# Patient Record
Sex: Female | Born: 1980 | Race: White | Hispanic: No | Marital: Single | Smoking: Current every day smoker
Health system: Southern US, Community
[De-identification: ages and names within clinical notes are randomized; demographics above are authoritative.]

## PROBLEM LIST (undated history)

## (undated) DIAGNOSIS — B999 Unspecified infectious disease: Secondary | ICD-10-CM

## (undated) DIAGNOSIS — N200 Calculus of kidney: Secondary | ICD-10-CM

## (undated) DIAGNOSIS — N390 Urinary tract infection, site not specified: Secondary | ICD-10-CM

## (undated) DIAGNOSIS — N159 Renal tubulo-interstitial disease, unspecified: Secondary | ICD-10-CM

## (undated) HISTORY — PX: DILATION AND CURETTAGE OF UTERUS: SHX78

## (undated) HISTORY — PX: DENTAL SURGERY: SHX609

---

## 2002-05-07 ENCOUNTER — Emergency Department (HOSPITAL_COMMUNITY): Admission: EM | Admit: 2002-05-07 | Discharge: 2002-05-08 | Payer: Self-pay | Admitting: Emergency Medicine

## 2002-05-08 ENCOUNTER — Encounter: Payer: Self-pay | Admitting: Emergency Medicine

## 2007-09-22 ENCOUNTER — Emergency Department: Payer: Self-pay | Admitting: Emergency Medicine

## 2007-10-22 ENCOUNTER — Emergency Department: Payer: Self-pay | Admitting: Emergency Medicine

## 2007-12-04 ENCOUNTER — Emergency Department: Payer: Self-pay | Admitting: Emergency Medicine

## 2008-03-10 ENCOUNTER — Emergency Department: Payer: Self-pay | Admitting: Emergency Medicine

## 2008-05-28 ENCOUNTER — Observation Stay: Payer: Self-pay | Admitting: Obstetrics and Gynecology

## 2008-06-17 ENCOUNTER — Observation Stay: Payer: Self-pay | Admitting: Obstetrics & Gynecology

## 2008-10-09 ENCOUNTER — Inpatient Hospital Stay: Payer: Self-pay | Admitting: Obstetrics and Gynecology

## 2009-05-12 ENCOUNTER — Emergency Department: Payer: Self-pay | Admitting: Emergency Medicine

## 2009-06-29 ENCOUNTER — Emergency Department: Payer: Self-pay | Admitting: Emergency Medicine

## 2010-01-16 ENCOUNTER — Emergency Department: Payer: Self-pay | Admitting: Emergency Medicine

## 2010-10-20 ENCOUNTER — Emergency Department: Payer: Self-pay | Admitting: Emergency Medicine

## 2010-12-05 ENCOUNTER — Ambulatory Visit: Payer: Self-pay | Admitting: Obstetrics & Gynecology

## 2010-12-06 ENCOUNTER — Ambulatory Visit: Payer: Self-pay | Admitting: Obstetrics & Gynecology

## 2010-12-11 LAB — PATHOLOGY REPORT

## 2010-12-27 ENCOUNTER — Emergency Department: Payer: Self-pay | Admitting: Unknown Physician Specialty

## 2010-12-28 ENCOUNTER — Ambulatory Visit: Payer: Self-pay

## 2011-03-17 ENCOUNTER — Emergency Department: Payer: Self-pay | Admitting: Emergency Medicine

## 2011-05-11 ENCOUNTER — Emergency Department: Payer: Self-pay | Admitting: Emergency Medicine

## 2011-05-16 ENCOUNTER — Emergency Department: Payer: Self-pay | Admitting: Unknown Physician Specialty

## 2011-06-09 ENCOUNTER — Emergency Department: Payer: Self-pay | Admitting: Internal Medicine

## 2011-06-19 ENCOUNTER — Emergency Department: Payer: Self-pay | Admitting: *Deleted

## 2011-08-31 ENCOUNTER — Emergency Department: Payer: Self-pay | Admitting: *Deleted

## 2011-08-31 ENCOUNTER — Emergency Department: Payer: Self-pay | Admitting: Unknown Physician Specialty

## 2011-11-12 ENCOUNTER — Emergency Department: Payer: Self-pay | Admitting: Emergency Medicine

## 2011-12-30 ENCOUNTER — Observation Stay: Payer: Self-pay | Admitting: Obstetrics and Gynecology

## 2011-12-30 LAB — URINALYSIS, COMPLETE
Blood: NEGATIVE
Glucose,UR: NEGATIVE mg/dL (ref 0–75)
Ketone: NEGATIVE
Nitrite: NEGATIVE
Specific Gravity: 1.01 (ref 1.003–1.030)

## 2012-01-22 ENCOUNTER — Observation Stay: Payer: Self-pay

## 2012-01-22 LAB — URINALYSIS, COMPLETE
Glucose,UR: NEGATIVE mg/dL (ref 0–75)
Ketone: NEGATIVE
Nitrite: NEGATIVE
Protein: NEGATIVE
Specific Gravity: 1.016 (ref 1.003–1.030)
WBC UR: 14 /HPF (ref 0–5)

## 2012-01-23 LAB — DRUG SCREEN, URINE
Amphetamines, Ur Screen: NEGATIVE (ref ?–1000)
Barbiturates, Ur Screen: NEGATIVE (ref ?–200)
Benzodiazepine, Ur Scrn: NEGATIVE (ref ?–200)
Cannabinoid 50 Ng, Ur ~~LOC~~: POSITIVE (ref ?–50)
Cocaine Metabolite,Ur ~~LOC~~: NEGATIVE (ref ?–300)
Methadone, Ur Screen: NEGATIVE (ref ?–300)
Tricyclic, Ur Screen: NEGATIVE (ref ?–1000)

## 2012-01-24 ENCOUNTER — Observation Stay: Payer: Self-pay

## 2012-01-24 LAB — CBC WITH DIFFERENTIAL/PLATELET
Basophil #: 0 10*3/uL (ref 0.0–0.1)
Basophil %: 0.2 %
Eosinophil %: 0.7 %
HCT: 27.9 % — ABNORMAL LOW (ref 35.0–47.0)
Lymphocyte %: 20.4 %
MCH: 29.2 pg (ref 26.0–34.0)
Neutrophil %: 74.2 %
Platelet: 310 10*3/uL (ref 150–440)
RBC: 3.26 10*6/uL — ABNORMAL LOW (ref 3.80–5.20)
WBC: 8.4 10*3/uL (ref 3.6–11.0)

## 2012-01-24 LAB — URINALYSIS, COMPLETE
Blood: NEGATIVE
Ketone: NEGATIVE
Nitrite: NEGATIVE
Protein: NEGATIVE
Specific Gravity: 1.017 (ref 1.003–1.030)
WBC UR: 68 /HPF (ref 0–5)

## 2012-01-24 LAB — BASIC METABOLIC PANEL
Calcium, Total: 7.6 mg/dL — ABNORMAL LOW (ref 8.5–10.1)
Co2: 22 mmol/L (ref 21–32)
Creatinine: 0.37 mg/dL — ABNORMAL LOW (ref 0.60–1.30)
EGFR (Non-African Amer.): >60
Glucose: 76 mg/dL (ref 65–99)
Osmolality: 275 (ref 275–301)
Potassium: 3.2 mmol/L — ABNORMAL LOW (ref 3.5–5.1)

## 2012-01-24 LAB — DRUG SCREEN, URINE
Barbiturates, Ur Screen: NEGATIVE (ref ?–200)
Benzodiazepine, Ur Scrn: NEGATIVE (ref ?–200)
Cocaine Metabolite,Ur ~~LOC~~: NEGATIVE (ref ?–300)
Methadone, Ur Screen: NEGATIVE (ref ?–300)
Opiate, Ur Screen: POSITIVE (ref ?–300)
Phencyclidine (PCP) Ur S: NEGATIVE (ref ?–25)

## 2012-01-24 LAB — FETAL FIBRONECTIN: Fetal Fibronectin: NEGATIVE

## 2012-01-27 LAB — URINE CULTURE

## 2012-01-29 LAB — URINE CULTURE

## 2012-03-31 ENCOUNTER — Inpatient Hospital Stay: Payer: Self-pay

## 2012-03-31 LAB — DRUG SCREEN, URINE
Barbiturates, Ur Screen: NEGATIVE (ref ?–200)
Cannabinoid 50 Ng, Ur ~~LOC~~: NEGATIVE (ref ?–50)
Cocaine Metabolite,Ur ~~LOC~~: NEGATIVE (ref ?–300)
Methadone, Ur Screen: NEGATIVE (ref ?–300)
Opiate, Ur Screen: POSITIVE (ref ?–300)
Tricyclic, Ur Screen: NEGATIVE (ref ?–1000)

## 2012-03-31 LAB — CBC WITH DIFFERENTIAL/PLATELET
Basophil #: 0.3 10*3/uL — ABNORMAL HIGH (ref 0.0–0.1)
Basophil %: 3.1 %
Eosinophil %: 1.5 %
HCT: 34.7 % — ABNORMAL LOW (ref 35.0–47.0)
Lymphocyte #: 2.1 10*3/uL (ref 1.0–3.6)
MCH: 29.7 pg (ref 26.0–34.0)
MCHC: 33.5 g/dL (ref 32.0–36.0)
MCV: 89 fL (ref 80–100)
Monocyte %: 5.2 %
Neutrophil #: 7.7 10*3/uL — ABNORMAL HIGH (ref 1.4–6.5)
RBC: 3.91 10*6/uL (ref 3.80–5.20)
RDW: 17.1 % — ABNORMAL HIGH (ref 11.5–14.5)
WBC: 10.9 10*3/uL (ref 3.6–11.0)

## 2012-04-01 LAB — HEMATOCRIT: HCT: 21.8 % — ABNORMAL LOW (ref 35.0–47.0)

## 2013-05-13 ENCOUNTER — Encounter (HOSPITAL_COMMUNITY): Payer: Self-pay | Admitting: Emergency Medicine

## 2013-05-13 ENCOUNTER — Emergency Department (HOSPITAL_COMMUNITY)
Admission: EM | Admit: 2013-05-13 | Discharge: 2013-05-13 | Disposition: A | Discharge status: Home / Self Care | Payer: Self-pay | Attending: Emergency Medicine | Admitting: Emergency Medicine

## 2013-05-13 ENCOUNTER — Emergency Department (HOSPITAL_COMMUNITY): Payer: Self-pay

## 2013-05-13 DIAGNOSIS — N390 Urinary tract infection, site not specified: Secondary | ICD-10-CM | POA: Insufficient documentation

## 2013-05-13 DIAGNOSIS — Z3202 Encounter for pregnancy test, result negative: Secondary | ICD-10-CM | POA: Insufficient documentation

## 2013-05-13 DIAGNOSIS — N938 Other specified abnormal uterine and vaginal bleeding: Secondary | ICD-10-CM | POA: Insufficient documentation

## 2013-05-13 DIAGNOSIS — Z880 Allergy status to penicillin: Secondary | ICD-10-CM | POA: Insufficient documentation

## 2013-05-13 DIAGNOSIS — R319 Hematuria, unspecified: Secondary | ICD-10-CM | POA: Insufficient documentation

## 2013-05-13 DIAGNOSIS — Z8742 Personal history of other diseases of the female genital tract: Secondary | ICD-10-CM | POA: Insufficient documentation

## 2013-05-13 DIAGNOSIS — F17200 Nicotine dependence, unspecified, uncomplicated: Secondary | ICD-10-CM | POA: Insufficient documentation

## 2013-05-13 DIAGNOSIS — R6883 Chills (without fever): Secondary | ICD-10-CM | POA: Insufficient documentation

## 2013-05-13 DIAGNOSIS — R300 Dysuria: Secondary | ICD-10-CM | POA: Insufficient documentation

## 2013-05-13 DIAGNOSIS — N200 Calculus of kidney: Secondary | ICD-10-CM | POA: Insufficient documentation

## 2013-05-13 DIAGNOSIS — R109 Unspecified abdominal pain: Secondary | ICD-10-CM | POA: Insufficient documentation

## 2013-05-13 DIAGNOSIS — R110 Nausea: Secondary | ICD-10-CM | POA: Insufficient documentation

## 2013-05-13 DIAGNOSIS — M549 Dorsalgia, unspecified: Secondary | ICD-10-CM | POA: Insufficient documentation

## 2013-05-13 DIAGNOSIS — N949 Unspecified condition associated with female genital organs and menstrual cycle: Secondary | ICD-10-CM | POA: Insufficient documentation

## 2013-05-13 HISTORY — DX: Renal tubulo-interstitial disease, unspecified: N15.9

## 2013-05-13 HISTORY — DX: Urinary tract infection, site not specified: N39.0

## 2013-05-13 HISTORY — DX: Calculus of kidney: N20.0

## 2013-05-13 LAB — URINALYSIS, ROUTINE W REFLEX MICROSCOPIC
Bilirubin Urine: NEGATIVE
Glucose, UA: NEGATIVE mg/dL
Ketones, ur: NEGATIVE mg/dL
Nitrite: POSITIVE — AB
Protein, ur: NEGATIVE mg/dL
Specific Gravity, Urine: 1.025 (ref 1.005–1.030)
Urobilinogen, UA: 1 mg/dL (ref 0.0–1.0)
pH: 6 (ref 5.0–8.0)

## 2013-05-13 LAB — URINE MICROSCOPIC-ADD ON

## 2013-05-13 LAB — POCT PREGNANCY, URINE: Preg Test, Ur: NEGATIVE

## 2013-05-13 MED ORDER — HYDROMORPHONE HCL PF 1 MG/ML IJ SOLN
0.5000 mg | Freq: Once | INTRAMUSCULAR | Status: AC
  Administered 2013-05-13: 0.5 mg via INTRAMUSCULAR
  Filled 2013-05-13: qty 1

## 2013-05-13 MED ORDER — HYDROCODONE-ACETAMINOPHEN 5-325 MG PO TABS: 1.0000 | ORAL_TABLET | ORAL | Status: DC | PRN

## 2013-05-13 MED ORDER — ONDANSETRON 4 MG PO TBDP
4.0000 mg | ORAL_TABLET | Freq: Once | ORAL | Status: AC
  Administered 2013-05-13: 4 mg via ORAL
  Filled 2013-05-13: qty 1

## 2013-05-13 MED ORDER — CEPHALEXIN 500 MG PO CAPS: 500.0000 mg | ORAL_CAPSULE | Freq: Four times a day (QID) | ORAL | Status: DC

## 2013-05-13 NOTE — ED Notes (Signed)
Pt c/o pelvic pain and states she is unsure if it is because of a UTI which she has a history of. Pt also states she is 5 days late for her menstrual period.

## 2013-05-13 NOTE — ED Provider Notes (Signed)
Medical screening examination/treatment/procedure(s) were performed by non-physician practitioner and as supervising physician I was immediately available for consultation/collaboration.   Gilda Crease, MD 05/13/13 1346

## 2013-05-13 NOTE — ED Provider Notes (Signed)
History    CSN: [id number] Arrival date & time 05/13/13  1102  First MD Initiated Contact with Patient 05/13/13 1118     No chief complaint on file.  (Consider location/radiation/quality/duration/timing/severity/associated sxs/prior Treatment) HPI Comments: 32 y/o female with a PMHx of kidney stones and endometriosis presents to the ED complaining of gradual onset suprapubic pain radiating around her flank to her back bilaterally beginning about 11 hours ago around midnight. Patient states she was just laying in bed when the pain began, got up to urinate and pain became severe, sharp rated 8/10. Sharp pains have been intermittent since. Admits to associated nausea and chills. Think there may have been blood in her urine. Admits to decreased urine output only urinative once per day over the past couple days. Denies increased urinary frequency, urgency, dysuria, fever. Diagnosed with kidney stones 6 years ago when she lived in [place] with multiple stones in her kidneys, however has not had any issues since. Denies vaginal bleeding or discharge. She was due for her menses 3 days ago and has not gotten it yet.  The history is provided by the patient.   No past medical history on file. No past surgical history on file. No family history on file. History  Substance Use Topics  . Smoking status: Not on file  . Smokeless tobacco: Not on file  . Alcohol Use: Not on file   OB History   No data available     Review of Systems  Constitutional: Positive for chills. Negative for fever.  Respiratory: Negative for shortness of breath.   Cardiovascular: Negative for chest pain.  Gastrointestinal: Positive for nausea and abdominal pain. Negative for vomiting.  Genitourinary: Positive for hematuria, flank pain, decreased urine volume and menstrual problem. Negative for dysuria, urgency, frequency, vaginal bleeding, vaginal discharge and vaginal pain.  Musculoskeletal: Positive for back pain.    All other systems reviewed and are negative.    Allergies  Penicillins  Home Medications  No current outpatient prescriptions on file. There were no vitals taken for this visit. Physical Exam  Nursing note and vitals reviewed. Constitutional: She is oriented to person, place, and time. She appears well-developed and well-nourished. No distress.  HENT:  Head: Normocephalic and atraumatic.  Mouth/Throat: Oropharynx is clear and moist.  Eyes: Conjunctivae are normal.  Neck: Normal range of motion. Neck supple.  Cardiovascular: Normal rate, regular rhythm and normal heart sounds.   Pulmonary/Chest: Effort normal and breath sounds normal.  Abdominal: Soft. Bowel sounds are normal. She exhibits no mass. There is tenderness in the suprapubic area. There is CVA tenderness (bilateral). There is no rigidity, no rebound and no guarding.    Musculoskeletal: Normal range of motion. She exhibits no edema.  Neurological: She is alert and oriented to person, place, and time.  Skin: Skin is warm and dry. She is not diaphoretic.  Psychiatric: She has a normal mood and affect. Her behavior is normal.    ED Course  Procedures (including critical care time) Labs Reviewed  URINALYSIS, ROUTINE W REFLEX MICROSCOPIC - Abnormal; Notable for the following:    APPearance CLOUDY (*)    Hgb urine dipstick TRACE (*)    Nitrite POSITIVE (*)    Leukocytes, UA MODERATE (*)    All other components within normal limits  URINE MICROSCOPIC-ADD ON - Abnormal; Notable for the following:    Squamous Epithelial / LPF MANY (*)    Bacteria, UA MANY (*)    All other components within normal limits  URINE CULTURE  POCT PREGNANCY, URINE   Ct Abdomen Pelvis Wo Contrast  05/13/2013   *RADIOLOGY REPORT*  Clinical Data: Pelvic pain.  History of urinary tract infection. History of kidney stones.  Bilateral flank the lower abdominal pain.  One episode hematuria.  CT ABDOMEN AND PELVIS WITHOUT CONTRAST  Technique:   Multidetector CT imaging of the abdomen and pelvis was performed following the standard protocol without intravenous contrast.  Comparison: None.  Findings: Bilateral nonobstructing renal calculi.  No evidence to suggest renal collecting system obstruction.  No obvious ureteral calculi.  Contracted urinary bladder without gross abnormality.  Evaluation of the bowel is limited secondary to lack of fat planes. No obvious bowel inflammatory process, free fluid or free air.  The appendix contains a small amount of radiopaque material without the shape of an appendicolith.  No obvious inflammation of the appendix however, the distal appendix blends in with adjacent right ovary where a corpus luteum cyst is noted and evaluation at this level is somewhat limited.  Evaluation of solid abdominal viscera is limited by lack of IV contrast.  Taking this limitation into account no worrisome hepatic, splenic, renal, adrenal or pancreatic abnormality. Elongated right lobe of the liver spans over 18 cm.  Gas filled prominent sized proximal duodenum may be related to the mild compression of the adjacent duodenum by vascular structures in this thin patient.  Contracted gallbladder with out evidence of calcified gallstone.  No bony destructive lesion.  No abdominal aortic aneurysm.  Limited for detecting adenopathy given the lack of fat planes.  IMPRESSION: Bilateral nonobstructing renal calculi.  No evidence to suggest renal collecting system obstruction.  No obvious ureteral calculi.  Evaluation of the bowel is limited secondary to lack of fat planes. No obvious bowel inflammatory process, free fluid or free air.  The appendix contains a small amount of radiopaque material without the shape of an appendicolith.  No obvious inflammation of the appendix however, the distal appendix blends in with adjacent right ovary where a corpus luteum cyst is noted and evaluation at this level is somewhat limited.   Original Report Authenticated  By: Lacy Duverney, M.D.   1. Urinary tract infection   2. Kidney stones     MDM  Patient with suprapubic and flank pain, decreased urine output, hematuria. UA, CT abd/pelvis w/o contrast for possible kidney stone. Last stone dx 6 years ago, no urologist. Dilaudid, zofran. 1:45 PM Patient with UTI and non-obstructing renal calculi. Keflex for UTI, vicodin for pain. Urine strainer given. She will f/u with urology. Return precautions discussed. Patient states understanding of plan and is agreeable.   Trevor Mace, PA-C 05/13/13 1345

## 2013-05-15 LAB — URINE CULTURE

## 2013-05-16 ENCOUNTER — Telehealth (HOSPITAL_COMMUNITY): Payer: Self-pay | Admitting: Emergency Medicine

## 2013-05-16 NOTE — ED Notes (Signed)
Post ED Visit - Positive Culture Follow-up  Culture report reviewed by antimicrobial stewardship pharmacist: [x]  Wes Dulaney, Pharm.D., BCPS []  Celedonio Miyamoto, Pharm.D., BCPS []  Georgina Pillion, Pharm.D., BCPS []  [place], [address].D., BCPS, AAHIVP []  Estella Husk, Pharm.D., BCPS, AAHIVP  Positive urine culture Treated with Keflex, organism sensitive to the same and no further patient follow-up is required at this time.  Kylie A Holland 05/16/2013, 5:44 PM

## 2013-05-18 ENCOUNTER — Encounter (HOSPITAL_COMMUNITY): Payer: Self-pay | Admitting: Emergency Medicine

## 2013-05-18 ENCOUNTER — Emergency Department (HOSPITAL_COMMUNITY)
Admission: EM | Admit: 2013-05-18 | Discharge: 2013-05-18 | Disposition: A | Discharge status: Home / Self Care | Payer: Self-pay | Attending: Emergency Medicine | Admitting: Emergency Medicine

## 2013-05-18 DIAGNOSIS — F17200 Nicotine dependence, unspecified, uncomplicated: Secondary | ICD-10-CM | POA: Insufficient documentation

## 2013-05-18 DIAGNOSIS — R109 Unspecified abdominal pain: Secondary | ICD-10-CM | POA: Insufficient documentation

## 2013-05-18 DIAGNOSIS — Z87440 Personal history of urinary (tract) infections: Secondary | ICD-10-CM | POA: Insufficient documentation

## 2013-05-18 DIAGNOSIS — Z3202 Encounter for pregnancy test, result negative: Secondary | ICD-10-CM | POA: Insufficient documentation

## 2013-05-18 DIAGNOSIS — M545 Low back pain, unspecified: Secondary | ICD-10-CM | POA: Insufficient documentation

## 2013-05-18 DIAGNOSIS — Z87442 Personal history of urinary calculi: Secondary | ICD-10-CM | POA: Insufficient documentation

## 2013-05-18 DIAGNOSIS — Z87448 Personal history of other diseases of urinary system: Secondary | ICD-10-CM | POA: Insufficient documentation

## 2013-05-18 DIAGNOSIS — Z880 Allergy status to penicillin: Secondary | ICD-10-CM | POA: Insufficient documentation

## 2013-05-18 LAB — URINALYSIS, ROUTINE W REFLEX MICROSCOPIC
Bilirubin Urine: NEGATIVE
Leukocytes, UA: NEGATIVE
Nitrite: NEGATIVE
Specific Gravity, Urine: 1.015 (ref 1.005–1.030)
Urobilinogen, UA: 0.2 mg/dL (ref 0.0–1.0)
pH: 6.5 (ref 5.0–8.0)

## 2013-05-18 LAB — URINE MICROSCOPIC-ADD ON

## 2013-05-18 LAB — PREGNANCY, URINE: Preg Test, Ur: NEGATIVE

## 2013-05-18 MED ORDER — HYDROCODONE-ACETAMINOPHEN 5-325 MG PO TABS: 2.0000 | ORAL_TABLET | ORAL | Status: DC | PRN

## 2013-05-18 MED ORDER — HYDROCODONE-ACETAMINOPHEN 5-325 MG PO TABS
1.0000 | ORAL_TABLET | Freq: Once | ORAL | Status: AC
  Administered 2013-05-18: 1 via ORAL
  Filled 2013-05-18: qty 1

## 2013-05-18 MED ORDER — KETOROLAC TROMETHAMINE 30 MG/ML IJ SOLN
30.0000 mg | Freq: Once | INTRAMUSCULAR | Status: AC
  Administered 2013-05-18: 30 mg via INTRAMUSCULAR
  Filled 2013-05-18: qty 1

## 2013-05-18 NOTE — ED Notes (Signed)
Pt presents to ED with c/o hypogastric pain, onset last Tuesday--- reports that she was here last Thursday and was diagnosed with "kidney stones and UTI", was discharged with pain medication and ABT; pt states that she has been tolerating pain well but today, "pain is just worse".

## 2013-05-18 NOTE — ED Provider Notes (Signed)
History    CSN: [id number] Arrival date & time 05/18/13  1845  First MD Initiated Contact with Patient 05/18/13 1921     Chief Complaint  Patient presents with  . Abdominal Pain   (Consider location/radiation/quality/duration/timing/severity/associated sxs/prior Treatment) HPI Comments: 32 yo female with kidney stone hx, was recently seen in ED for CT scan showing non obstructing stone, no cause of abdominal pain presents as she is out of her pain meds and cannot afford to follow up with urology.  Pain since Thursday. No change in pain. No fevers.  She is taking abx.  Pain left lower flank.  Intermittent.  Patient is a 32 y.o. female presenting with abdominal pain. The history is provided by the patient.  Abdominal Pain This is a recurrent problem. Associated symptoms include abdominal pain. Pertinent negatives include no chest pain, no headaches and no shortness of breath.   Past Medical History  Diagnosis Date  . UTI (lower urinary tract infection)   . Kidney stones   . Kidney infection    Past Surgical History  Procedure Laterality Date  . Dilation and curettage of uterus    . Dental surgery     History reviewed. No pertinent family history. History  Substance Use Topics  . Smoking status: Current Every Day Smoker -- 0.50 packs/day    Types: Cigarettes  . Smokeless tobacco: Not on file  . Alcohol Use: No   OB History   Grav Para Term Preterm Abortions TAB SAB Ect Mult Living                 Review of Systems  Constitutional: Negative for fever and chills.  HENT: Negative for neck pain and neck stiffness.   Eyes: Negative for visual disturbance.  Respiratory: Negative for shortness of breath.   Cardiovascular: Negative for chest pain.  Gastrointestinal: Positive for abdominal pain. Negative for vomiting.  Genitourinary: Positive for flank pain. Negative for dysuria.  Musculoskeletal: Positive for back pain.  Skin: Negative for rash.  Neurological: Negative for  light-headedness and headaches.    Allergies  Penicillins  Home Medications   Current Outpatient Rx  Name  Route  Sig  Dispense  Refill  . Aspirin-Salicylamide-Caffeine (BC HEADACHE POWDER PO)   Oral   Take 1 packet by mouth 2 (two) times daily as needed (for pain).         . cephALEXin (KEFLEX) 500 MG capsule   Oral   Take 1 capsule (500 mg total) by mouth 4 (four) times daily.   28 capsule   0    BP 98/65  Pulse 74  Temp(Src) 98.7 F (37.1 C) (Oral)  Resp 18  SpO2 100%  LMP 04/08/2013 Physical Exam  Nursing note and vitals reviewed. Constitutional: She is oriented to person, place, and time. She appears well-developed and well-nourished.  HENT:  Head: Normocephalic and atraumatic.  Eyes: Conjunctivae are normal. Right eye exhibits no discharge. Left eye exhibits no discharge.  Neck: Normal range of motion. Neck supple. No tracheal deviation present.  Cardiovascular: Normal rate and regular rhythm.   Pulmonary/Chest: Effort normal and breath sounds normal.  Abdominal: Soft. She exhibits no distension. There is no tenderness. There is no guarding.  Musculoskeletal: She exhibits tenderness (mild left paraspinal lumbar/ flank). She exhibits no edema.  Neurological: She is alert and oriented to person, place, and time.  Skin: Skin is warm. No rash noted.  Psychiatric: She has a normal mood and affect.    ED Course  Procedures (  including critical care time) Limited abdomen ultrasound Indication: flank pain Focused abdominal ultrasound with kidneys imaged in two planes with bladder Visualized in transverse plane. No hydronephrosis visualized.  No stones visualized.   Images stored on the machine.   Labs Reviewed  URINALYSIS, ROUTINE W REFLEX MICROSCOPIC  PREGNANCY, URINE   No results found. No diagnosis found.  MDM  Well appearing.  No obstructing stones, CT scan reviewed.  Pain meds given.  ?endometriosis vs stones vs other.   Close fup discussed,  medicaid in process per pt.  Pain improved on recheck.  DC discussed.    Enid Skeens, MD 05/18/13 2024

## 2013-07-13 ENCOUNTER — Emergency Department (HOSPITAL_COMMUNITY)
Admission: EM | Admit: 2013-07-13 | Discharge: 2013-07-13 | Disposition: A | Discharge status: Home / Self Care | Payer: Self-pay | Attending: Emergency Medicine | Admitting: Emergency Medicine

## 2013-07-13 ENCOUNTER — Encounter (HOSPITAL_COMMUNITY): Payer: Self-pay | Admitting: Emergency Medicine

## 2013-07-13 DIAGNOSIS — N390 Urinary tract infection, site not specified: Secondary | ICD-10-CM | POA: Insufficient documentation

## 2013-07-13 DIAGNOSIS — F17200 Nicotine dependence, unspecified, uncomplicated: Secondary | ICD-10-CM | POA: Insufficient documentation

## 2013-07-13 DIAGNOSIS — Z87440 Personal history of urinary (tract) infections: Secondary | ICD-10-CM | POA: Insufficient documentation

## 2013-07-13 DIAGNOSIS — R110 Nausea: Secondary | ICD-10-CM | POA: Insufficient documentation

## 2013-07-13 DIAGNOSIS — R509 Fever, unspecified: Secondary | ICD-10-CM | POA: Insufficient documentation

## 2013-07-13 DIAGNOSIS — R319 Hematuria, unspecified: Secondary | ICD-10-CM | POA: Insufficient documentation

## 2013-07-13 DIAGNOSIS — N12 Tubulo-interstitial nephritis, not specified as acute or chronic: Secondary | ICD-10-CM | POA: Insufficient documentation

## 2013-07-13 DIAGNOSIS — Z3202 Encounter for pregnancy test, result negative: Secondary | ICD-10-CM | POA: Insufficient documentation

## 2013-07-13 DIAGNOSIS — Z880 Allergy status to penicillin: Secondary | ICD-10-CM | POA: Insufficient documentation

## 2013-07-13 DIAGNOSIS — R109 Unspecified abdominal pain: Secondary | ICD-10-CM | POA: Insufficient documentation

## 2013-07-13 LAB — URINALYSIS, ROUTINE W REFLEX MICROSCOPIC
Bilirubin Urine: NEGATIVE
Glucose, UA: NEGATIVE mg/dL
Ketones, ur: NEGATIVE mg/dL
Protein, ur: NEGATIVE mg/dL
Urobilinogen, UA: 2 mg/dL — ABNORMAL HIGH (ref 0.0–1.0)

## 2013-07-13 LAB — URINE MICROSCOPIC-ADD ON

## 2013-07-13 LAB — POCT PREGNANCY, URINE: Preg Test, Ur: NEGATIVE

## 2013-07-13 MED ORDER — CIPROFLOXACIN HCL 500 MG PO TABS: 500.0000 mg | ORAL_TABLET | Freq: Two times a day (BID) | ORAL | Status: DC

## 2013-07-13 MED ORDER — HYDROCODONE-ACETAMINOPHEN 5-325 MG PO TABS: 1.0000 | ORAL_TABLET | Freq: Four times a day (QID) | ORAL | Status: DC | PRN

## 2013-07-13 MED ORDER — ONDANSETRON HCL 4 MG PO TABS: 4.0000 mg | ORAL_TABLET | Freq: Four times a day (QID) | ORAL | Status: DC

## 2013-07-13 MED ORDER — ONDANSETRON 4 MG PO TBDP
4.0000 mg | ORAL_TABLET | Freq: Once | ORAL | Status: AC
  Administered 2013-07-13: 4 mg via ORAL
  Filled 2013-07-13: qty 1

## 2013-07-13 MED ORDER — KETOROLAC TROMETHAMINE 60 MG/2ML IM SOLN
60.0000 mg | Freq: Once | INTRAMUSCULAR | Status: AC
  Administered 2013-07-13: 60 mg via INTRAMUSCULAR
  Filled 2013-07-13: qty 2

## 2013-07-13 NOTE — ED Notes (Signed)
Pt escorted to discharge window. Pt verbalized understanding discharge instructions. In no acute distress.  

## 2013-07-13 NOTE — Progress Notes (Signed)
[facility] provided patient with a list of primary care resources. Patient stated that she had an apt to get the [facility] on 10/9.

## 2013-07-13 NOTE — ED Provider Notes (Signed)
CSN: [id number]     Arrival date & time 07/13/13  0757 History   First MD Initiated Contact with Patient 07/13/13 0800     Chief Complaint  Patient presents with  . Flank Pain   (Consider location/radiation/quality/duration/timing/severity/associated sxs/prior Treatment) HPI Comments: 32 year old female the past medical history of kidney stones presents to the emergency department complaining of sudden onset right-sided back pain radiating around her flank to her right lower abdomen beginning last night around 7:00 PM. Since then pain has been intermittent, sharp and stabbing rated 10 out of 10. She has not tried any alleviating factors. Admits to associated increased urinary frequency, urgency and dysuria. States when she urinated last night she felt like she was "peeing out a piece of glass" he noticed some blood in her urine. States today her urine did not have any blood. Admits to associated fever of 101.6 last night, has not taken anything to reduce the fever. Also complaining of nausea without vomiting. She was seen in the emergency department on July 15, had a CT scan which showed bilateral nonobstructing kidney stones. She did not have any urology followup.  Patient is a 32 y.o. female presenting with flank pain. The history is provided by the patient.  Flank Pain Associated symptoms include abdominal pain, a fever and nausea. Pertinent negatives include no chest pain, chills or vomiting.    Past Medical History  Diagnosis Date  . UTI (lower urinary tract infection)   . Kidney stones   . Kidney infection    Past Surgical History  Procedure Laterality Date  . Dilation and curettage of uterus    . Dental surgery     No family history on file. History  Substance Use Topics  . Smoking status: Current Every Day Smoker -- 0.50 packs/day    Types: Cigarettes  . Smokeless tobacco: Not on file  . Alcohol Use: No   OB History   Grav Para Term Preterm Abortions TAB SAB Ect Mult Living                   Review of Systems  Constitutional: Positive for fever. Negative for chills.  Respiratory: Negative for shortness of breath.   Cardiovascular: Negative for chest pain.  Gastrointestinal: Positive for nausea and abdominal pain. Negative for vomiting.  Genitourinary: Positive for dysuria, urgency, frequency, hematuria and flank pain. Negative for difficulty urinating.  Musculoskeletal: Positive for back pain.  All other systems reviewed and are negative.    Allergies  Penicillins  Home Medications   Current Outpatient Rx  Name  Route  Sig  Dispense  Refill  . Aspirin-Salicylamide-Caffeine (BC HEADACHE POWDER PO)   Oral   Take 1 packet by mouth 2 (two) times daily as needed (for pain).         . cephALEXin (KEFLEX) 500 MG capsule   Oral   Take 1 capsule (500 mg total) by mouth 4 (four) times daily.   28 capsule   0   . HYDROcodone-acetaminophen (NORCO/VICODIN) 5-325 MG per tablet   Oral   Take 2 tablets by mouth every 4 (four) hours as needed for pain.   6 tablet   0    BP 127/95  Pulse 85  Temp(Src) 98.2 F (36.8 C) (Oral)  Resp 18  SpO2 100%  LMP 07/06/2013 Physical Exam  Nursing note and vitals reviewed. Constitutional: She is oriented to person, place, and time. She appears well-developed and well-nourished. No distress.  HENT:  Head: Normocephalic and atraumatic.  Mouth/Throat: Oropharynx is clear and moist.  Eyes: Conjunctivae are normal.  Neck: Normal range of motion. Neck supple.  Cardiovascular: Normal rate, regular rhythm and normal heart sounds.   Pulmonary/Chest: Effort normal and breath sounds normal.  Abdominal: Soft. Normal appearance and bowel sounds are normal. She exhibits no distension and no mass. There is tenderness. There is CVA tenderness (right). There is no rigidity, no rebound and no guarding.    No peritoneal signs.  Musculoskeletal: Normal range of motion. She exhibits no edema.  Neurological: She is alert and  oriented to person, place, and time.  Skin: Skin is warm and dry. She is not diaphoretic.  Psychiatric: She has a normal mood and affect. Her behavior is normal.    ED Course  Procedures (including critical care time) Labs Review Labs Reviewed  URINALYSIS, ROUTINE W REFLEX MICROSCOPIC - Abnormal; Notable for the following:    APPearance CLOUDY (*)    Hgb urine dipstick MODERATE (*)    Urobilinogen, UA 2.0 (*)    Nitrite POSITIVE (*)    Leukocytes, UA MODERATE (*)    All other components within normal limits  URINE MICROSCOPIC-ADD ON - Abnormal; Notable for the following:    Squamous Epithelial / LPF FEW (*)    Bacteria, UA MANY (*)    All other components within normal limits  URINE CULTURE  POCT PREGNANCY, URINE   Imaging Review No results found.  MDM   1. UTI (urinary tract infection)   2. Flank pain   3. Pyelonephritis     Patient with right-sided flank pain, associated dysuria, increased frequency, urgency and nausea. Most likely passed a kidney stone last night. Known hx of stones. Will check a urine sample to evaluate for any infection. She is not having any difficulty urinating at this time. She is well appearing, afebrile and in no apparent distress. Normal vital signs. Toradol for pain, zofran for nausea.  9:29 AM Patient reports pain improved to 4/10, no longer nauseated. She has a UTI, probable pyelonephritis along with passing a kidney stone. Currently she is afebrile and in no apparent distress. She is stable for discharge with Cipro, Vicodin for pain and Zofran. She will followup with PCP. Return precautions discussed. Patient states understanding of plan and is agreeable.  Trevor Mace, PA-C 07/13/13 0930

## 2013-07-13 NOTE — ED Provider Notes (Signed)
Medical screening examination/treatment/procedure(s) were performed by non-physician practitioner and as supervising physician I was immediately available for consultation/collaboration. Devoria Albe, MD, [facility]   Ward Givens, MD 07/13/13 [phone]

## 2013-07-13 NOTE — ED Notes (Signed)
Pt states that she was here a couple months ago and was told that she has kidney stones.  States that she passed one last night.  C/o bilateral flank pain.  States that she has also been running a fever (101.6 last night).  C/o Nausea but no vomiting or diarrhea.

## 2013-07-14 LAB — URINE CULTURE: Colony Count: >=100000

## 2014-02-14 ENCOUNTER — Encounter (HOSPITAL_COMMUNITY): Payer: Self-pay | Admitting: Emergency Medicine

## 2014-02-14 ENCOUNTER — Emergency Department (HOSPITAL_COMMUNITY)
Admission: EM | Admit: 2014-02-14 | Discharge: 2014-02-14 | Discharge status: Against Medical Advice | Payer: Self-pay | Attending: Emergency Medicine | Admitting: Emergency Medicine

## 2014-02-14 DIAGNOSIS — R300 Dysuria: Secondary | ICD-10-CM | POA: Insufficient documentation

## 2014-02-14 DIAGNOSIS — R319 Hematuria, unspecified: Secondary | ICD-10-CM | POA: Insufficient documentation

## 2014-02-14 DIAGNOSIS — Z87442 Personal history of urinary calculi: Secondary | ICD-10-CM | POA: Insufficient documentation

## 2014-02-14 DIAGNOSIS — R109 Unspecified abdominal pain: Secondary | ICD-10-CM | POA: Insufficient documentation

## 2014-02-14 DIAGNOSIS — Z3202 Encounter for pregnancy test, result negative: Secondary | ICD-10-CM | POA: Insufficient documentation

## 2014-02-14 DIAGNOSIS — F17200 Nicotine dependence, unspecified, uncomplicated: Secondary | ICD-10-CM | POA: Insufficient documentation

## 2014-02-14 HISTORY — DX: Unspecified infectious disease: B99.9

## 2014-02-14 LAB — URINALYSIS, ROUTINE W REFLEX MICROSCOPIC
Bilirubin Urine: NEGATIVE
Glucose, UA: NEGATIVE mg/dL
KETONES UR: NEGATIVE mg/dL
Nitrite: POSITIVE — AB
PROTEIN: NEGATIVE mg/dL
Specific Gravity, Urine: 1.021 (ref 1.005–1.030)
Urobilinogen, UA: 0.2 mg/dL (ref 0.0–1.0)
pH: 5.5 (ref 5.0–8.0)

## 2014-02-14 LAB — POC URINE PREG, ED: Preg Test, Ur: NEGATIVE

## 2014-02-14 LAB — URINE MICROSCOPIC-ADD ON

## 2014-02-14 NOTE — ED Notes (Signed)
Pt can not be located

## 2014-02-14 NOTE — ED Notes (Signed)
Patient states she was in the ED last week for kidney stones. Patient states she knows she passed 2 kidney stones.. Patient reports increased pain tot he right flank area. Patient states she was suppose to schedule surgery, but can not afford the surgery. Patient c/o dysuria and blood in her urine.

## 2014-03-28 ENCOUNTER — Emergency Department: Payer: Self-pay | Admitting: Emergency Medicine

## 2014-06-12 ENCOUNTER — Emergency Department (HOSPITAL_COMMUNITY)
Admission: EM | Admit: 2014-06-12 | Discharge: 2014-06-13 | Disposition: A | Discharge status: Home / Self Care | Payer: Self-pay | Attending: Emergency Medicine | Admitting: Emergency Medicine

## 2014-06-12 ENCOUNTER — Encounter (HOSPITAL_COMMUNITY): Payer: Self-pay | Admitting: Emergency Medicine

## 2014-06-12 DIAGNOSIS — Z9889 Other specified postprocedural states: Secondary | ICD-10-CM | POA: Insufficient documentation

## 2014-06-12 DIAGNOSIS — N83201 Unspecified ovarian cyst, right side: Secondary | ICD-10-CM

## 2014-06-12 DIAGNOSIS — F17200 Nicotine dependence, unspecified, uncomplicated: Secondary | ICD-10-CM | POA: Insufficient documentation

## 2014-06-12 DIAGNOSIS — M549 Dorsalgia, unspecified: Secondary | ICD-10-CM | POA: Insufficient documentation

## 2014-06-12 DIAGNOSIS — R1084 Generalized abdominal pain: Secondary | ICD-10-CM | POA: Insufficient documentation

## 2014-06-12 DIAGNOSIS — Z87442 Personal history of urinary calculi: Secondary | ICD-10-CM | POA: Insufficient documentation

## 2014-06-12 DIAGNOSIS — Z880 Allergy status to penicillin: Secondary | ICD-10-CM | POA: Insufficient documentation

## 2014-06-12 DIAGNOSIS — N390 Urinary tract infection, site not specified: Secondary | ICD-10-CM | POA: Insufficient documentation

## 2014-06-12 DIAGNOSIS — N83209 Unspecified ovarian cyst, unspecified side: Secondary | ICD-10-CM | POA: Insufficient documentation

## 2014-06-12 DIAGNOSIS — R1110 Vomiting, unspecified: Secondary | ICD-10-CM | POA: Insufficient documentation

## 2014-06-12 DIAGNOSIS — Z3202 Encounter for pregnancy test, result negative: Secondary | ICD-10-CM | POA: Insufficient documentation

## 2014-06-12 NOTE — ED Notes (Signed)
Pt arrived to the ED with a complaint of back pain.  Pt states that the pain radiates to the front lower abdomen.  Pt states she has had emesis.  Pt have had two episodes of emesis.  Pt states the pain in her back feels like contractions

## 2014-06-13 ENCOUNTER — Emergency Department (HOSPITAL_COMMUNITY): Payer: Self-pay

## 2014-06-13 LAB — COMPREHENSIVE METABOLIC PANEL
ALT: 11 U/L (ref 0–35)
AST: 27 U/L (ref 0–37)
Albumin: 4.5 g/dL (ref 3.5–5.2)
Alkaline Phosphatase: 56 U/L (ref 39–117)
Anion gap: 17 — ABNORMAL HIGH (ref 5–15)
BUN: 24 mg/dL — ABNORMAL HIGH (ref 6–23)
CO2: 21 mEq/L (ref 19–32)
Calcium: 9.4 mg/dL (ref 8.4–10.5)
Chloride: 102 mEq/L (ref 96–112)
Creatinine, Ser: 0.56 mg/dL (ref 0.50–1.10)
GFR calc Af Amer: >90 mL/min (ref >90)
GFR calc non Af Amer: >90 mL/min (ref >90)
Glucose, Bld: 92 mg/dL (ref 70–99)
Potassium: 4 mEq/L (ref 3.7–5.3)
Sodium: 140 mEq/L (ref 137–147)
Total Bilirubin: <0.2 mg/dL — ABNORMAL LOW (ref 0.3–1.2)
Total Protein: 8.5 g/dL — ABNORMAL HIGH (ref 6.0–8.3)

## 2014-06-13 LAB — PREGNANCY, URINE: Preg Test, Ur: NEGATIVE

## 2014-06-13 LAB — URINALYSIS, ROUTINE W REFLEX MICROSCOPIC
Bilirubin Urine: NEGATIVE
Glucose, UA: NEGATIVE mg/dL
Hgb urine dipstick: NEGATIVE
Ketones, ur: NEGATIVE mg/dL
Nitrite: POSITIVE — AB
Protein, ur: NEGATIVE mg/dL
Specific Gravity, Urine: 1.015 (ref 1.005–1.030)
Urobilinogen, UA: 0.2 mg/dL (ref 0.0–1.0)
pH: 5.5 (ref 5.0–8.0)

## 2014-06-13 LAB — CBC WITH DIFFERENTIAL/PLATELET
Basophils Absolute: 0 10*3/uL (ref 0.0–0.1)
Basophils Relative: 0 % (ref 0–1)
Eosinophils Absolute: 0.3 10*3/uL (ref 0.0–0.7)
Eosinophils Relative: 3 % (ref 0–5)
HCT: 44.4 % (ref 36.0–46.0)
Hemoglobin: 15.2 g/dL — ABNORMAL HIGH (ref 12.0–15.0)
Lymphocytes Relative: 32 % (ref 12–46)
Lymphs Abs: 2.9 10*3/uL (ref 0.7–4.0)
MCH: 29.6 pg (ref 26.0–34.0)
MCHC: 34.2 g/dL (ref 30.0–36.0)
MCV: 86.5 fL (ref 78.0–100.0)
Monocytes Absolute: 0.6 10*3/uL (ref 0.1–1.0)
Monocytes Relative: 6 % (ref 3–12)
Neutro Abs: 5.5 10*3/uL (ref 1.7–7.7)
Neutrophils Relative %: 59 % (ref 43–77)
Platelets: 386 10*3/uL (ref 150–400)
RBC: 5.13 MIL/uL — ABNORMAL HIGH (ref 3.87–5.11)
RDW: 13 % (ref 11.5–15.5)
WBC: 9.3 10*3/uL (ref 4.0–10.5)

## 2014-06-13 LAB — URINE MICROSCOPIC-ADD ON

## 2014-06-13 MED ORDER — PROMETHAZINE HCL 25 MG PO TABS: 25.0000 mg | ORAL_TABLET | Freq: Three times a day (TID) | ORAL | Status: AC | PRN

## 2014-06-13 MED ORDER — SODIUM CHLORIDE 0.9 % IV BOLUS (SEPSIS)
2000.0000 mL | Freq: Once | INTRAVENOUS | Status: AC
  Administered 2014-06-13: 2000 mL via INTRAVENOUS

## 2014-06-13 MED ORDER — IOHEXOL 300 MG/ML  SOLN
50.0000 mL | Freq: Once | INTRAMUSCULAR | Status: AC | PRN
  Administered 2014-06-13: 50 mL via ORAL

## 2014-06-13 MED ORDER — IOHEXOL 300 MG/ML  SOLN
60.0000 mL | Freq: Once | INTRAMUSCULAR | Status: AC | PRN
  Administered 2014-06-13: 60 mL via INTRAVENOUS

## 2014-06-13 MED ORDER — CIPROFLOXACIN IN D5W 400 MG/200ML IV SOLN
400.0000 mg | Freq: Once | INTRAVENOUS | Status: AC
  Administered 2014-06-13: 400 mg via INTRAVENOUS
  Filled 2014-06-13: qty 200

## 2014-06-13 MED ORDER — ONDANSETRON HCL 4 MG/2ML IJ SOLN
4.0000 mg | Freq: Once | INTRAMUSCULAR | Status: AC
  Administered 2014-06-13: 4 mg via INTRAVENOUS
  Filled 2014-06-13: qty 2

## 2014-06-13 MED ORDER — MORPHINE SULFATE 4 MG/ML IJ SOLN
6.0000 mg | Freq: Once | INTRAMUSCULAR | Status: AC
  Administered 2014-06-13: 6 mg via INTRAVENOUS
  Filled 2014-06-13: qty 2

## 2014-06-13 MED ORDER — NAPROXEN 375 MG PO TABS
375.0000 mg | ORAL_TABLET | Freq: Once | ORAL | Status: AC
  Administered 2014-06-13: 375 mg via ORAL
  Filled 2014-06-13: qty 1

## 2014-06-13 MED ORDER — CIPROFLOXACIN HCL 500 MG PO TABS: 500.0000 mg | ORAL_TABLET | Freq: Two times a day (BID) | ORAL | Status: AC

## 2014-06-13 NOTE — ED Provider Notes (Signed)
Medical screening examination/treatment/procedure(s) were performed by non-physician practitioner and as supervising physician I was immediately available for consultation/collaboration.   EKG Interpretation None        Courtney F Horton, MD 06/13/14 0737 

## 2014-06-13 NOTE — ED Provider Notes (Signed)
Pt with UTI, will be treated with cipro.  Awaits CT scan to r/o acute emergent condition.  Likely d/c with cipro.    2:13 AM Patient is a thin-appearing Caucasian female is appears to be older than stated age. Heart S1-S2 mild tachycardia without murmurs gallops, lungs are clear to auscultation bilaterally, abdomen is soft but diffusely tender with guarding but without rebound tenderness. No hernia noted. Bilateral CVA tenderness to percussion.  4:16 AM Abd/pelvis CT without acute finding.  2.4cm physiologic right ovarian cyst.  Pt able to tolerates PO.  Will d/c with cipro, naproxen and outpt f/u.  Standard return precaution given.    BP 122/79  Pulse 69  Temp(Src) 97.8 F (36.6 C) (Oral)  Resp 18  Wt 67 lb 12.8 oz (30.754 kg)  SpO2 99%  LMP 05/24/2014  I have reviewed nursing notes and vital signs. I personally reviewed the imaging tests through PACS system  I reviewed available ER/hospitalization records thought the EMR  Results for orders placed during the hospital encounter of 06/12/14  URINALYSIS, ROUTINE W REFLEX MICROSCOPIC      Result Value Ref Range   Color, Urine YELLOW  YELLOW   APPearance CLOUDY (*) CLEAR   Specific Gravity, Urine 1.015  1.005 - 1.030   pH 5.5  5.0 - 8.0   Glucose, UA NEGATIVE  NEGATIVE mg/dL   Hgb urine dipstick NEGATIVE  NEGATIVE   Bilirubin Urine NEGATIVE  NEGATIVE   Ketones, ur NEGATIVE  NEGATIVE mg/dL   Protein, ur NEGATIVE  NEGATIVE mg/dL   Urobilinogen, UA 0.2  0.0 - 1.0 mg/dL   Nitrite POSITIVE (*) NEGATIVE   Leukocytes, UA SMALL (*) NEGATIVE  PREGNANCY, URINE      Result Value Ref Range   Preg Test, Ur NEGATIVE  NEGATIVE  CBC WITH DIFFERENTIAL      Result Value Ref Range   WBC 9.3  4.0 - 10.5 K/uL   RBC 5.13 (*) 3.87 - 5.11 MIL/uL   Hemoglobin 15.2 (*) 12.0 - 15.0 g/dL   HCT 65.7  84.6 - 96.2 %   MCV 86.5  78.0 - 100.0 fL   MCH 29.6  26.0 - 34.0 pg   MCHC 34.2  30.0 - 36.0 g/dL   RDW 95.2  84.1 - 32.4 %   Platelets 386  150 - 400  K/uL   Neutrophils Relative % 59  43 - 77 %   Neutro Abs 5.5  1.7 - 7.7 K/uL   Lymphocytes Relative 32  12 - 46 %   Lymphs Abs 2.9  0.7 - 4.0 K/uL   Monocytes Relative 6  3 - 12 %   Monocytes Absolute 0.6  0.1 - 1.0 K/uL   Eosinophils Relative 3  0 - 5 %   Eosinophils Absolute 0.3  0.0 - 0.7 K/uL   Basophils Relative 0  0 - 1 %   Basophils Absolute 0.0  0.0 - 0.1 K/uL  COMPREHENSIVE METABOLIC PANEL      Result Value Ref Range   Sodium 140  137 - 147 mEq/L   Potassium 4.0  3.7 - 5.3 mEq/L   Chloride 102  96 - 112 mEq/L   CO2 21  19 - 32 mEq/L   Glucose, Bld 92  70 - 99 mg/dL   BUN 24 (*) 6 - 23 mg/dL   Creatinine, Ser 4.01  0.50 - 1.10 mg/dL   Calcium 9.4  8.4 - 02.7 mg/dL   Total Protein 8.5 (*) 6.0 - 8.3 g/dL  Albumin 4.5  3.5 - 5.2 g/dL   AST 27  0 - 37 U/L   ALT 11  0 - 35 U/L   Alkaline Phosphatase 56  39 - 117 U/L   Total Bilirubin <0.2 (*) 0.3 - 1.2 mg/dL   GFR calc non Af Amer >90  >90 mL/min   GFR calc Af Amer >90  >90 mL/min   Anion gap 17 (*) 5 - 15  URINE MICROSCOPIC-ADD ON      Result Value Ref Range   Squamous Epithelial / LPF FEW (*) RARE   WBC, UA 7-10  <3 WBC/hpf   RBC / HPF 0-2  <3 RBC/hpf   Bacteria, UA MANY (*) RARE   Urine-Other MANY YEAST     Ct Abdomen Pelvis W Contrast  06/13/2014   CLINICAL DATA:  Back pain, radiating to lower abdomen, nausea, vomiting.  EXAM: CT ABDOMEN AND PELVIS WITH CONTRAST  TECHNIQUE: Multidetector CT imaging of the abdomen and pelvis was performed using the standard protocol following bolus administration of intravenous contrast.  CONTRAST:  60mL OMNIPAQUE IOHEXOL 300 MG/ML  SOLN  COMPARISON:  Prior CT from 05/13/2013  FINDINGS: Mild subsegmental atelectasis seen dependently within the visualized lung bases.  The liver demonstrates a normal contrast enhanced appearance. Prominent periportal edema is present with prominent pericholecystic fluid density, likely related to patient's volume status. Liver and gallbladder are otherwise  unremarkable. Spleen, adrenal glands, and pancreas demonstrate a normal contrast enhanced appearance.  Symmetric enhancement seen within the kidneys bilaterally. Focal parenchymal scarring within the upper pole of the right kidney is similar to prior. There is associated calcification within this region. Additional small punctate calcification within the right kidney likely reflects small nonobstructive stone, grossly stable from prior. No evidence of right-sided obstructive uropathy.  The left kidney is within normal limits without evidence of nephrolithiasis or hydronephrosis.  Stomach within normal limits. No evidence of bowel obstruction. No abnormal wall thickening, mucosal enhancement, or inflammatory fat stranding seen about the bowels. Appendix is well visualized in the right lower quadrant and is of normal caliber and appearance without associated inflammatory changes to suggest acute appendicitis.  Bladder within normal limits. Uterus and left ovary are unremarkable. 2.4 cm cystic lesion within the right ovary likely reflects a normal physiologic cyst. Pelvic vascular congestion noted.  Trace free fluid present within the pelvis, likely physiologic. No free intraperitoneal air. No adenopathy. Normal intravascular enhancement seen throughout the abdomen and pelvis.  No acute osseous abnormality. No worrisome lytic or blastic osseous lesions.  IMPRESSION: 1. No CT evidence of acute intra-abdominal or pelvic process identified. 2. Cortical scarring within the right kidney with small punctate nonobstructive calculi, grossly stable from prior No CT evidence of obstructive uropathy. Previously seen left renal nephrolithiasis not visualized on this contrast-enhanced exam. 3. Normal appendix. 4. 2.4 cm physiologic right ovarian cyst.   Electronically Signed   By: Rise MuBenjamin  McClintock M.D.   On: 06/13/2014 03:56      Fayrene HelperBowie Shene Maxfield, PA-C 06/13/14 [phone]

## 2014-06-13 NOTE — ED Provider Notes (Signed)
CSN: [id number]     Arrival date & time 06/12/14  2317 History   First MD Initiated Contact with Patient 06/12/14 2348     Chief Complaint  Patient presents with  . Abdominal Pain  . Back Pain     (Consider location/radiation/quality/duration/timing/severity/associated sxs/prior Treatment) HPI Patient presents to the emergency department with back pain, and diffuse, abdominal pain, over the last 2 weeks.  The patient, states the back pain radiates to the front part of her abdomen.  She, states she's had several episodes of emesis.  She states she feels spasms in her back.  The patient denies chest pain, shortness of breath, headache, blurred vision, weakness, dizziness, fever, vaginal discharge, vaginal bleeding, hematemesis, diarrhea, bloody stool, altered mental status, rash or syncope.  Patient, states she did not take any medications prior to arrival.  Patient, states, that nothing seems to make her condition, better or worse.  The patient, states the pain initially started as intermittent, but has become more constant over the last 3 days Past Medical History  Diagnosis Date  . UTI (lower urinary tract infection)   . Kidney stones   . Kidney infection   . Infection     infection from rice.   Past Surgical History  Procedure Laterality Date  . Dilation and curettage of uterus    . Dental surgery     Family History  Problem Relation Age of Onset  . Aneurysm Mother   . Heart failure Father    History  Substance Use Topics  . Smoking status: Current Every Day Smoker -- 0.50 packs/day    Types: Cigarettes  . Smokeless tobacco: Not on file  . Alcohol Use: No   OB History   Grav Para Term Preterm Abortions TAB SAB Ect Mult Living                 Review of Systems All other systems negative except as documented in the HPI. All pertinent positives and negatives as reviewed in the HPI.   Allergies  Penicillins and Tramadol  Home Medications   Prior to Admission  medications   Medication Sig Start Date End Date Taking? Authorizing Provider  Aspirin-Salicylamide-Caffeine (BC HEADACHE POWDER PO) Take 1 packet by mouth as needed (headache).   Yes Historical Provider, MD   BP 116/89  Pulse 96  Temp(Src) 98.1 F (36.7 C) (Oral)  Resp 18  Wt 67 lb 12.8 oz (30.754 kg)  SpO2 96%  LMP 05/24/2014 Physical Exam  Nursing note and vitals reviewed. Constitutional: She is oriented to person, place, and time. Vital signs are normal. She appears well-developed. She appears cachectic. No distress.  HENT:  Head: Normocephalic and atraumatic.  Mouth/Throat: Oropharynx is clear and moist.  Eyes: Pupils are equal, round, and reactive to light.  Neck: Normal range of motion. Neck supple.  Cardiovascular: Normal rate, regular rhythm and normal heart sounds.  Exam reveals no gallop and no friction rub.   No murmur heard. Pulmonary/Chest: Effort normal and breath sounds normal. No respiratory distress.  Abdominal: Soft. Normal appearance and bowel sounds are normal. She exhibits no distension. There is generalized tenderness. There is no rigidity, no rebound and no guarding.  Neurological: She is alert and oriented to person, place, and time. She exhibits normal muscle tone. Coordination normal.  Skin: Skin is warm and dry.    ED Course  Procedures (including critical care time) Labs Review Labs Reviewed  URINALYSIS, ROUTINE W REFLEX MICROSCOPIC - Abnormal; Notable for the following:  APPearance CLOUDY (*)    Nitrite POSITIVE (*)    Leukocytes, UA SMALL (*)    All other components within normal limits  URINE MICROSCOPIC-ADD ON - Abnormal; Notable for the following:    Squamous Epithelial / LPF FEW (*)    Bacteria, UA MANY (*)    All other components within normal limits  PREGNANCY, URINE  CBC WITH DIFFERENTIAL  COMPREHENSIVE METABOLIC PANEL    Imaging Review No results found.      Carlyle Dollyhristopher W Evellyn Tuff, PA-C 06/13/14 [phone]

## 2014-06-13 NOTE — ED Notes (Signed)
Per RN, pt to get IV. RN will obtain labs then.

## 2014-06-13 NOTE — Discharge Instructions (Signed)
Return here as needed.  Follow up with a primary care Dr. or an urgent care.  Slowly increase your fluid intake, rest as much possible

## 2014-06-13 NOTE — ED Provider Notes (Signed)
Medical screening examination/treatment/procedure(s) were performed by non-physician practitioner and as supervising physician I was immediately available for consultation/collaboration.   EKG Interpretation None        Destani Wamser F Heaven Meeker, MD 06/13/14 0737 

## 2014-10-09 ENCOUNTER — Emergency Department: Payer: Self-pay | Admitting: Emergency Medicine

## 2014-10-09 LAB — COMPREHENSIVE METABOLIC PANEL
Albumin: 3.5 g/dL (ref 3.4–5.0)
Alkaline Phosphatase: 63 U/L
Anion Gap: 7 (ref 7–16)
BILIRUBIN TOTAL: 0.1 mg/dL — AB (ref 0.2–1.0)
BUN: 24 mg/dL — AB (ref 7–18)
CHLORIDE: 113 mmol/L — AB (ref 98–107)
Calcium, Total: 8.3 mg/dL — ABNORMAL LOW (ref 8.5–10.1)
Co2: 23 mmol/L (ref 21–32)
Creatinine: 0.59 mg/dL — ABNORMAL LOW (ref 0.60–1.30)
EGFR (African American): >60
EGFR (Non-African Amer.): >60
GLUCOSE: 60 mg/dL — AB (ref 65–99)
OSMOLALITY: 287 (ref 275–301)
Potassium: 3.7 mmol/L (ref 3.5–5.1)
SGOT(AST): 20 U/L (ref 15–37)
SGPT (ALT): 24 U/L
Sodium: 143 mmol/L (ref 136–145)
TOTAL PROTEIN: 7 g/dL (ref 6.4–8.2)

## 2014-10-09 LAB — CBC WITH DIFFERENTIAL/PLATELET
BASOS ABS: 0.1 10*3/uL (ref 0.0–0.1)
Basophil %: 0.9 %
EOS PCT: 1.5 %
Eosinophil #: 0.1 10*3/uL (ref 0.0–0.7)
HCT: 39.7 % (ref 35.0–47.0)
HGB: 13 g/dL (ref 12.0–16.0)
LYMPHS ABS: 1.5 10*3/uL (ref 1.0–3.6)
Lymphocyte %: 18.8 %
MCH: 29.2 pg (ref 26.0–34.0)
MCHC: 32.8 g/dL (ref 32.0–36.0)
MCV: 89 fL (ref 80–100)
MONO ABS: 0.6 x10 3/mm (ref 0.2–0.9)
Monocyte %: 7.4 %
NEUTROS PCT: 71.4 %
Neutrophil #: 5.9 10*3/uL (ref 1.4–6.5)
PLATELETS: 344 10*3/uL (ref 150–440)
RBC: 4.46 10*6/uL (ref 3.80–5.20)
RDW: 13.5 % (ref 11.5–14.5)
WBC: 8.3 10*3/uL (ref 3.6–11.0)

## 2014-10-09 LAB — URINALYSIS, COMPLETE
BILIRUBIN, UR: NEGATIVE
Blood: NEGATIVE
GLUCOSE, UR: NEGATIVE mg/dL (ref 0–75)
NITRITE: NEGATIVE
PH: 6 (ref 4.5–8.0)
Protein: NEGATIVE
Specific Gravity: 1.027 (ref 1.003–1.030)
Squamous Epithelial: 6
WBC UR: 4 /HPF (ref 0–5)

## 2014-10-09 LAB — LIPASE, BLOOD: LIPASE: 171 U/L (ref 73–393)

## 2014-11-02 ENCOUNTER — Emergency Department (HOSPITAL_COMMUNITY): Payer: Self-pay

## 2014-11-02 ENCOUNTER — Encounter (HOSPITAL_COMMUNITY): Payer: Self-pay | Admitting: Emergency Medicine

## 2014-11-02 ENCOUNTER — Emergency Department (HOSPITAL_COMMUNITY)
Admission: EM | Admit: 2014-11-02 | Discharge: 2014-11-02 | Discharge status: Against Medical Advice | Payer: Self-pay | Attending: Emergency Medicine | Admitting: Emergency Medicine

## 2014-11-02 DIAGNOSIS — R1110 Vomiting, unspecified: Secondary | ICD-10-CM | POA: Insufficient documentation

## 2014-11-02 DIAGNOSIS — M546 Pain in thoracic spine: Secondary | ICD-10-CM | POA: Insufficient documentation

## 2014-11-02 DIAGNOSIS — Z87448 Personal history of other diseases of urinary system: Secondary | ICD-10-CM | POA: Insufficient documentation

## 2014-11-02 DIAGNOSIS — R109 Unspecified abdominal pain: Secondary | ICD-10-CM | POA: Insufficient documentation

## 2014-11-02 DIAGNOSIS — Z880 Allergy status to penicillin: Secondary | ICD-10-CM | POA: Insufficient documentation

## 2014-11-02 DIAGNOSIS — Z720 Tobacco use: Secondary | ICD-10-CM | POA: Insufficient documentation

## 2014-11-02 DIAGNOSIS — Z8619 Personal history of other infectious and parasitic diseases: Secondary | ICD-10-CM | POA: Insufficient documentation

## 2014-11-02 DIAGNOSIS — Z87440 Personal history of urinary (tract) infections: Secondary | ICD-10-CM | POA: Insufficient documentation

## 2014-11-02 DIAGNOSIS — F419 Anxiety disorder, unspecified: Secondary | ICD-10-CM | POA: Insufficient documentation

## 2014-11-02 DIAGNOSIS — M545 Low back pain: Secondary | ICD-10-CM | POA: Insufficient documentation

## 2014-11-02 DIAGNOSIS — Z87442 Personal history of urinary calculi: Secondary | ICD-10-CM | POA: Insufficient documentation

## 2014-11-02 LAB — CBC WITH DIFFERENTIAL/PLATELET
BASOS ABS: 0 10*3/uL (ref 0.0–0.1)
Basophils Relative: 1 % (ref 0–1)
Eosinophils Absolute: 0.1 10*3/uL (ref 0.0–0.7)
Eosinophils Relative: 2 % (ref 0–5)
HEMATOCRIT: 42.7 % (ref 36.0–46.0)
HEMOGLOBIN: 14.2 g/dL (ref 12.0–15.0)
LYMPHS ABS: 2.2 10*3/uL (ref 0.7–4.0)
LYMPHS PCT: 33 % (ref 12–46)
MCH: 29.6 pg (ref 26.0–34.0)
MCHC: 33.3 g/dL (ref 30.0–36.0)
MCV: 89 fL (ref 78.0–100.0)
MONO ABS: 0.7 10*3/uL (ref 0.1–1.0)
Monocytes Relative: 11 % (ref 3–12)
NEUTROS ABS: 3.6 10*3/uL (ref 1.7–7.7)
Neutrophils Relative %: 53 % (ref 43–77)
Platelets: 291 10*3/uL (ref 150–400)
RBC: 4.8 MIL/uL (ref 3.87–5.11)
RDW: 14.3 % (ref 11.5–15.5)
WBC: 6.6 10*3/uL (ref 4.0–10.5)

## 2014-11-02 LAB — COMPREHENSIVE METABOLIC PANEL
ALT: 18 U/L (ref 0–35)
AST: 19 U/L (ref 0–37)
Albumin: 4.2 g/dL (ref 3.5–5.2)
Alkaline Phosphatase: 50 U/L (ref 39–117)
Anion gap: 7 (ref 5–15)
BILIRUBIN TOTAL: 0.3 mg/dL (ref 0.3–1.2)
BUN: 33 mg/dL — ABNORMAL HIGH (ref 6–23)
CHLORIDE: 107 meq/L (ref 96–112)
CO2: 23 mmol/L (ref 19–32)
CREATININE: 0.6 mg/dL (ref 0.50–1.10)
Calcium: 9 mg/dL (ref 8.4–10.5)
Glucose, Bld: 59 mg/dL — ABNORMAL LOW (ref 70–99)
Potassium: 3.8 mmol/L (ref 3.5–5.1)
Sodium: 137 mmol/L (ref 135–145)
Total Protein: 7.5 g/dL (ref 6.0–8.3)

## 2014-11-02 LAB — LIPASE, BLOOD: LIPASE: 33 U/L (ref 11–59)

## 2014-11-02 NOTE — ED Provider Notes (Signed)
CSN: [id number]     Arrival date & time 11/02/14  1543 History   First MD Initiated Contact with Patient 11/02/14 1607     No chief complaint on file.    (Consider location/radiation/quality/duration/timing/severity/associated sxs/prior Treatment) HPI The patient has had between 2-4 weeks of right sided flank pain.  She reports that he used to come and go but over the past 2 weeks has become nearly constant in nature. It is made worse by certain movements and position changes. She denies pain or burning or urgency with urination. She reports however does make her flank area hurt more when she urinates. She denies vaginal discharge or bleeding. The pain is aching and cramping in nature. She reports that she vomits if she eats anything. She reports she is vomiting at least once a day. She denies any diarrhea. Patient denies cough, shortness of breath or chest pain. Past Medical History  Diagnosis Date  . UTI (lower urinary tract infection)   . Kidney stones   . Kidney infection   . Infection     infection from rice.   Past Surgical History  Procedure Laterality Date  . Dilation and curettage of uterus    . Dental surgery     Family History  Problem Relation Age of Onset  . Aneurysm Mother   . Heart failure Father    History  Substance Use Topics  . Smoking status: Current Every Day Smoker -- 0.50 packs/day    Types: Cigarettes  . Smokeless tobacco: Not on file  . Alcohol Use: No   OB History    No data available     Review of Systems 10 Systems reviewed and are negative for acute change except as noted in the HPI.    Allergies  Penicillins and Tramadol  Home Medications   Prior to Admission medications   Medication Sig Start Date End Date Taking? Authorizing Provider  Aspirin-Salicylamide-Caffeine (BC HEADACHE POWDER PO) Take 1 packet by mouth as needed (headache).   Yes Historical Provider, MD  ciprofloxacin (CIPRO) 500 MG tablet Take 1 tablet (500 mg total) by  mouth every 12 (twelve) hours. Patient not taking: Reported on 11/02/2014 06/13/14   Jamesetta Orleanshristopher W Lawyer, PA-C  promethazine (PHENERGAN) 25 MG tablet Take 1 tablet (25 mg total) by mouth every 8 (eight) hours as needed for nausea or vomiting. Patient not taking: Reported on 11/02/2014 06/13/14   Jamesetta Orleanshristopher W Lawyer, PA-C   BP 117/78 mmHg  Pulse 108  Temp(Src) 98.9 F (37.2 C) (Oral)  Resp 18  SpO2 100%  LMP 10/19/2014 Physical Exam  Constitutional: She is oriented to person, place, and time.  The patient is very thin, borderline cachectic in appearance. She is nontoxic and alert. She has no respiratory distress. She is intermittently tearful.  HENT:  Head: Normocephalic and atraumatic.  Eyes: EOM are normal. Pupils are equal, round, and reactive to light.  Neck: Neck supple.  Cardiovascular: Normal rate, regular rhythm, normal heart sounds and intact distal pulses.   Pulmonary/Chest: Effort normal and breath sounds normal. No respiratory distress. She has no wheezes. She has no rales.  Abdominal: Soft. Bowel sounds are normal. She exhibits no distension and no mass. There is tenderness. There is no rebound and no guarding.  Patient endorses pain to the entire right lateral aspect of her abdomen. There is no guarding or appreciable mass.  Musculoskeletal: Normal range of motion. She exhibits no edema.  The patient endorses tenderness even to light palpation with my digits from  her sacroiliac region on the right up to her lower thoracic chest bony elements. She also endorsed pain along the bony elements of the spine throughout the lumbar and lower thoracic spine.  Neurological: She is alert and oriented to person, place, and time. She has normal strength. Coordination normal. GCS eye subscore is 4. GCS verbal subscore is 5. GCS motor subscore is 6.  Skin: Skin is warm, dry and intact.  Psychiatric:  Patient is tearful and mildly anxious.    ED Course  Procedures (including critical care  time) Labs Review Labs Reviewed  CBC WITH DIFFERENTIAL  COMPREHENSIVE METABOLIC PANEL  LIPASE, BLOOD  URINALYSIS, ROUTINE W REFLEX MICROSCOPIC  URINE RAPID DRUG SCREEN (HOSP PERFORMED)  POC URINE PREG, ED    Imaging Review [facility]  11/02/2014   CLINICAL DATA:  Right flank pain for 2 weeks.  Nausea, vomiting.  EXAM: RENAL/URINARY TRACT ULTRASOUND COMPLETE  COMPARISON:  CT 06/13/2014  FINDINGS: Right Kidney:  Length: 10.7 cm. Echogenicity within normal limits. No mass or hydronephrosis visualized.  Left Kidney:  Length: 10.4 cm. Echogenicity within normal limits. No mass or hydronephrosis visualized.  Bladder:  Appears normal for degree of bladder distention.  IMPRESSION: Unremarkable renal ultrasound.   Electronically Signed   By: Charlett NoseKevin  Dover M.D.   On: 11/02/2014 16:44     EKG Interpretation None      MDM   Final diagnoses:  Flank pain   18:23 Notified that the patient has eloped without notifying staff. Urinalysis had not been provided by patient. Metabolic panel is still pending. Ultrasound does not show any evidence of hydronephrosis or concerning renal findings. The patient's general condition on physical examination is clinically well. Currently the patient is considered to have eloped with a diagnosis of nonspecific flank pain.    Hannah BarretteMarcy Xzaiver Vayda, MD 11/02/14 [phone]

## 2014-11-02 NOTE — ED Notes (Signed)
Went back in to reassess and ask pt to obtain urine specimen. Pt not found to be in room, pt's gown lying on bed and monitor removed.

## 2014-11-02 NOTE — ED Notes (Signed)
Ultrasound at bedside at this time.

## 2014-11-02 NOTE — ED Notes (Signed)
Pt appears to have left with IV in place. No IV found in trash can or linen on bed. [employer] made aware of situation and states that [place] is not their jurisdiction. MD and Charge made aware of situation and will investigate if any further action needs to take place.

## 2014-11-02 NOTE — ED Notes (Signed)
Pt with hx of renal calculi reports emesis x several weeks, right sided flank pain and abdominal pain. Denies urinary symptoms.

## 2015-03-14 NOTE — H&P (Signed)
L&D Evaluation:  History Expanded:   HPI 34 yo [id number] WF w estimated date of confinement 04/16/12 with c/o abd pain and symptoms consistent with kidney stone and/or infection.  Patient has had limited Prenatal Care with [facility] and some care at [facility].  She has been discharged [facility] due to repeated non-compliance putting her and her fetus at risk, due to nutritional and weight gain concerns and hyperemesis early on.  Patient has had 3 prior vaginal deliveries and reports similar weights and nausea concerns with each of those.  Because [facility] has been [facility], Hannah Bright is only one left to see patient.  Patient is accepting of our care.  Patient expresses no concerns over weight or size.  Patient reports recent pain as lower back and some contraction pains.  No h/o of Preterm Labor this early (one delivery near term).  Patient received shot last week for 'infection' but received no Rx for continued ABX use.  Patient reports she passed a kidney stone last week.  Good FM.    Gravida 5    Term 2    PreTerm 1    Abortion 1    Living 3    Blood Type A positive    Maternal Varicella Immune    Rubella Results immune    Presents with abdominal pain, back pain    Patient's Medical History No Chronic Illness    Patient's Surgical History D&C    Medications Pre Natal Vitamins    Allergies PCN, Morphine    Social History tobacco    Family History Non-Contributory   ROS:   ROS All systems were reviewed.  HEENT, CNS, GI, GU, Respiratory, CV, Renal and Musculoskeletal systems were found to be normal.   Exam:   Vital Signs stable    General no apparent distress    Mental Status clear    Abdomen gravid, non-tender    Estimated Fetal Weight Average for gestational age    Back no CVAT    Edema no edema    Pelvic no external lesions, cervix closed and thick, fetal fibronectin obtained    FHT normal rate with no decels    Ucx  irregular    Skin dry    Other See labs, including UA highly suggestive of UTI.   Impression:   Impression Pyelonephritis.  Possible stone. Dehydration.  Preterm Labor related to that but hopeful NOT true Preterm Labor.   Plan:   Plan EFM/NST, monitor contractions and for cervical change    Comments 1. fetal fibronectin pending.  If neg, patient reassured as to higher liklihood of not being in Preterm Labor. 2. Monitoring and fluids 3. Rocephin for IV ABX, then Macrobid as outpatient daily suppressive therapy after treatment doses for 5 days. 4. Antiemesis as needed. 5. One dose of terb used early on 6. Needs Prenatal Care.  Plans continued [facility]. 7. Will help with care.  Encourage adequate nutrition and Prenatal Care.  Plan to deliver here.   Electronic Signatures: Hannah Bright, Hannah Bright (MD)  (Signed 22-Mar-13 22:28)  Authored: L&D Evaluation   Last Updated: 22-Mar-13 22:28 by Hannah Bright, Hannah Bright (MD)

## 2015-03-14 NOTE — H&P (Signed)
L&D Evaluation:  History Expanded:   HPI 34 yo G5P3013 at 3437 5/7 weeks, EDD of 04/16/12, presented in active labor dilated 7 cm per RN. No LOF, some bloody show. + FM. Patient has had limited Prenatal Care with [facility] and 2 visits [facility].  [facility] due to repeated non-compliance.  History notable for underweight (BMI 11.5), tobacco, morphine and marijuana use, incarceration and frequent UTIs for which she took daily Macrobid. Pt denies recent morphine use, but she reports taking Vicodin (from old Rx) this past week for pain.    Gravida 5    Term 3    PreTerm 0    Abortion 1    Living 3    Blood Type A positive    Group B Strep Results (Result >5wks must be treated as unknown) positive    Maternal HIV Negative    Maternal Syphilis Ab Nonreactive    Maternal Varicella Immune    Rubella Results immune    Maternal T-Dap Nonimmune    Presents with contractions    Patient's Medical History No Chronic Illness    Patient's Surgical History D&C    Medications Pre Natal Vitamins  Macrobid    Allergies PCN, Morphine    Social History tobacco  drugs    Family History Non-Contributory   ROS:   ROS All systems were reviewed.  HEENT, CNS, GI, GU, Respiratory, CV, Renal and Musculoskeletal systems were found to be normal.   Exam:   Vital Signs stable    General no apparent distress, undernourished    Mental Status clear    Chest clear    Heart no murmur/gallop/rubs    Abdomen gravid, tender with contractions    Estimated Fetal Weight Small for gestational age    Back no CVAT    Edema no edema    Reflexes 2+    Pelvic no external lesions, 9/100/1+    FHT decreased variability, + accels    FHT Description Decreased Variability    Ucx regular    Skin dry   Impression:   Impression active labor   Plan:   Plan antibiotics for GBBS prophylaxis, UDS    Comments Pt requesting pain medication. Discussed  option of AROM and probable eminent delivery, pt in agreement. AROM with scant, blood-tinged fluid obtained. Pt proceeded to complete and pushing shortly after.   Electronic Signatures: Vella KohlerBrothers, Trezure Cronk K (CNM)  (Signed 28-May-13 03:41)  Authored: L&D Evaluation   Last Updated: 28-May-13 03:41 by Vella KohlerBrothers, Demetris Meinhardt K (CNM)

## 2015-04-08 IMAGING — CT CT ABD-PELV W/O CM
1 series · 12 of 14 positions shown, 18 images · non-contrast
Comparison: None.

CLINICAL DATA: Pelvic pain.  History of urinary tract infection.
History of kidney stones.  Bilateral flank the lower abdominal
pain.  One episode hematuria.

CT ABDOMEN AND PELVIS WITHOUT CONTRAST
TECHNIQUE: Multidetector CT imaging of the abdomen and pelvis was
performed following the standard protocol without intravenous
contrast.

[Series 4: lung · axial · 0.52mm/px · z∈[+1364,+1418]mm · 12 of 14 slices shown, 18 images]
[im 2/14  soft-tissue]
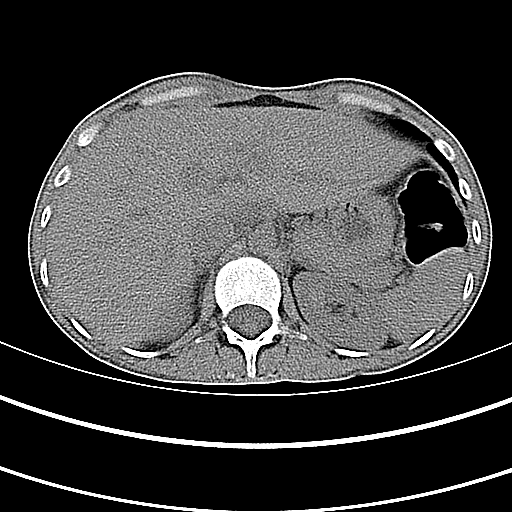
[im 2/14  bone]
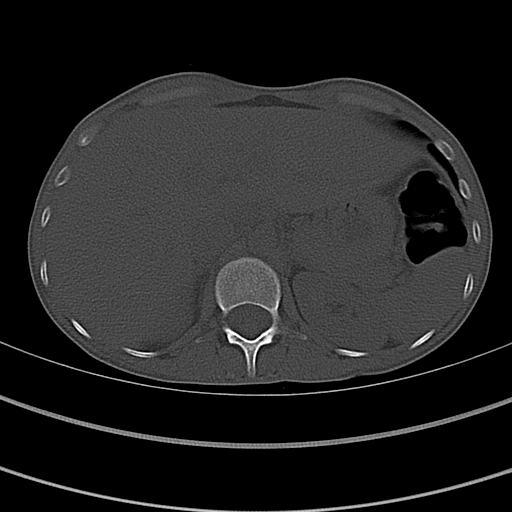
[im 3/14  soft-tissue]
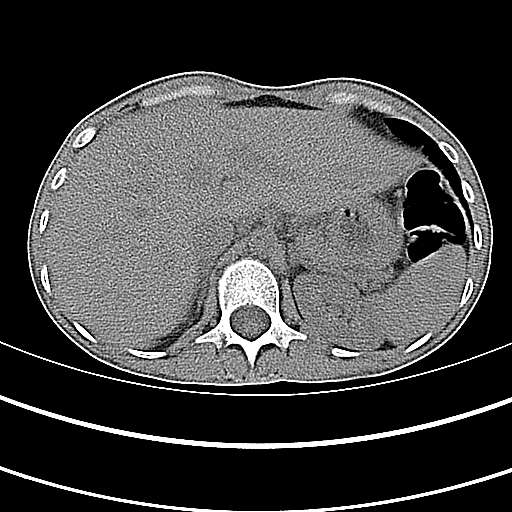
[im 4/14  soft-tissue]
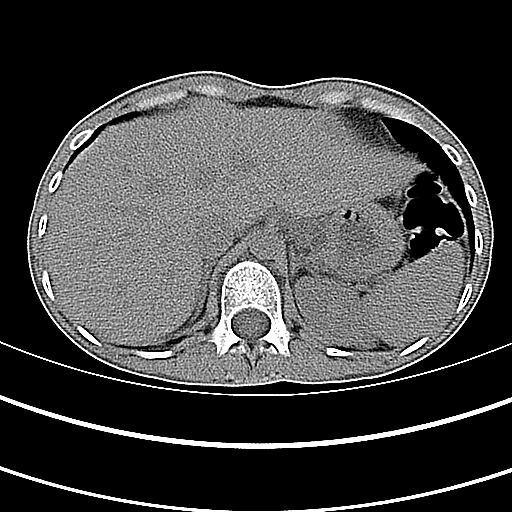
[im 5/14  soft-tissue]
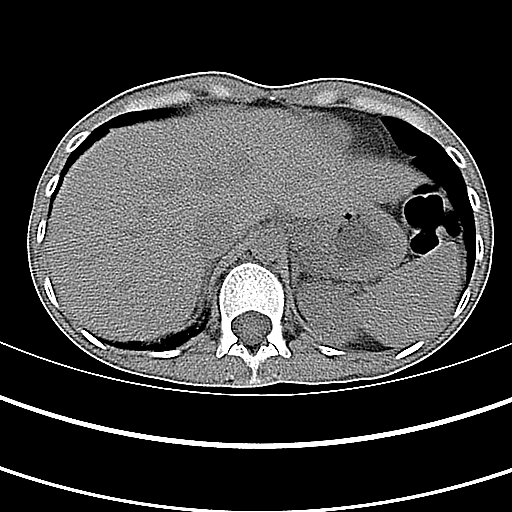
[im 6/14  soft-tissue]
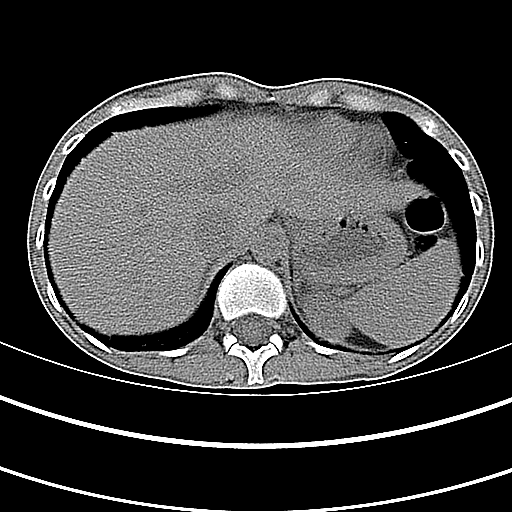
[im 7/14  soft-tissue]
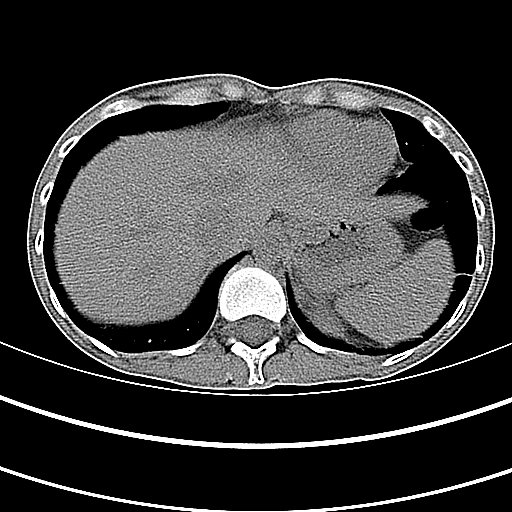
[im 8/14  soft-tissue]
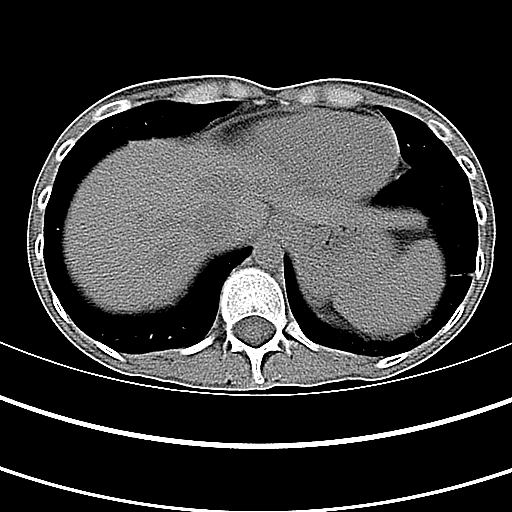
[im 9/14  soft-tissue]
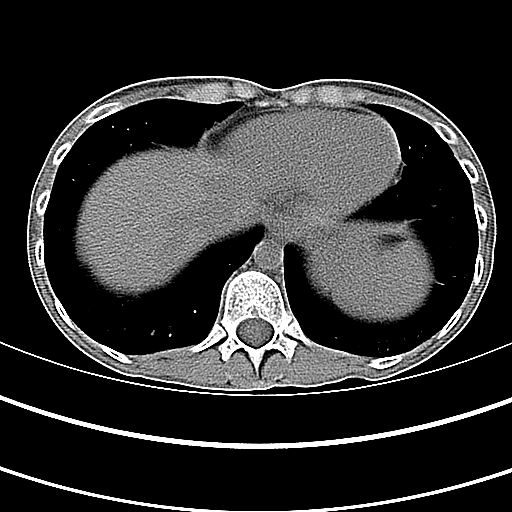
[im 10/14  soft-tissue]
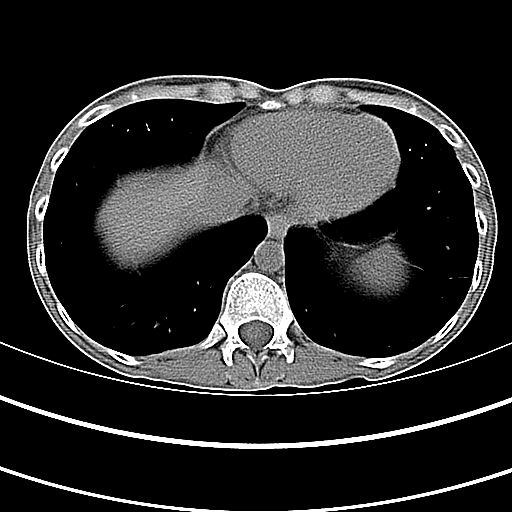
[im 10/14  lung]
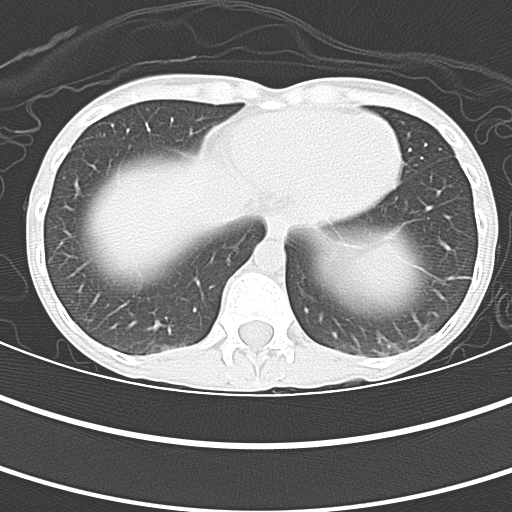
[im 10/14  bone]
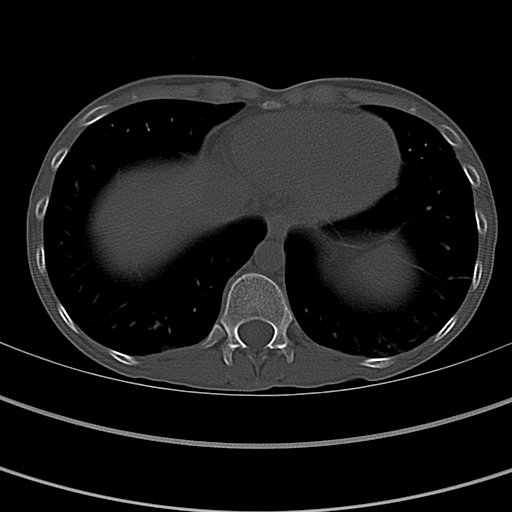
[im 11/14  soft-tissue]
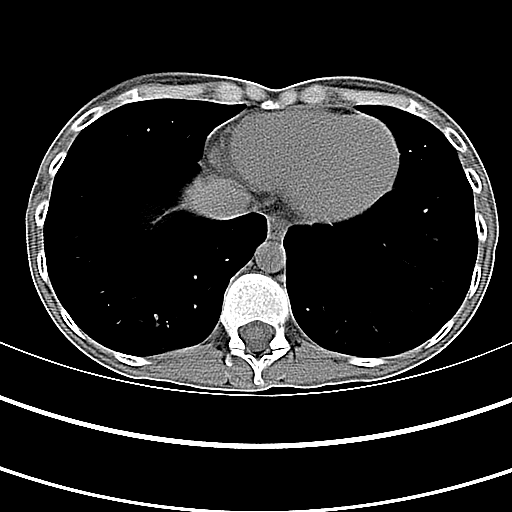
[im 11/14  lung]
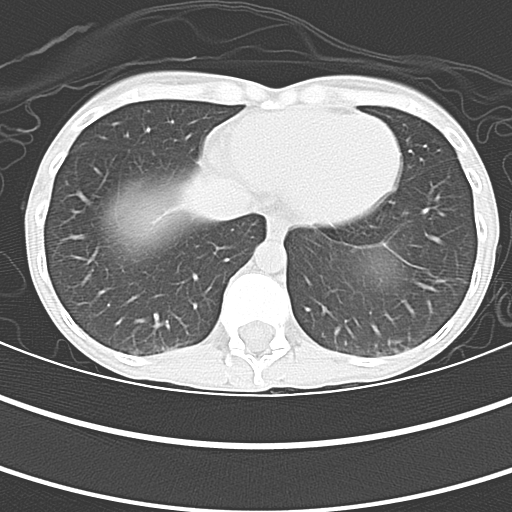
[im 12/14  soft-tissue]
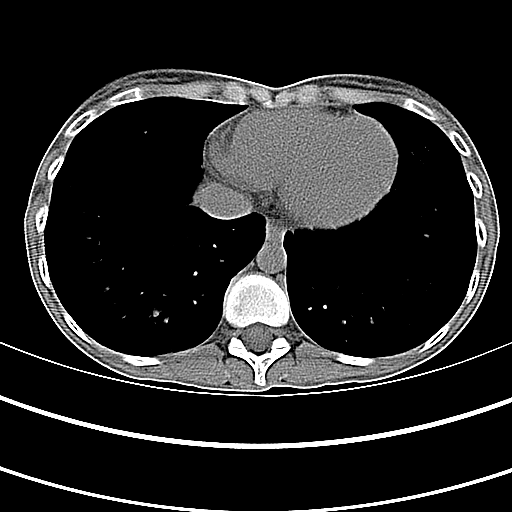
[im 12/14  lung]
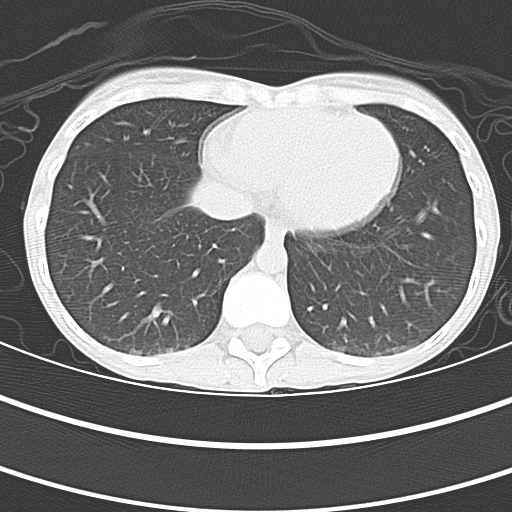
[im 13/14  soft-tissue]
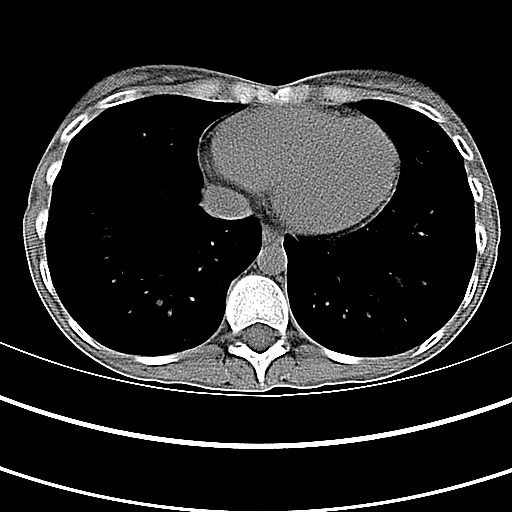
[im 13/14  lung]
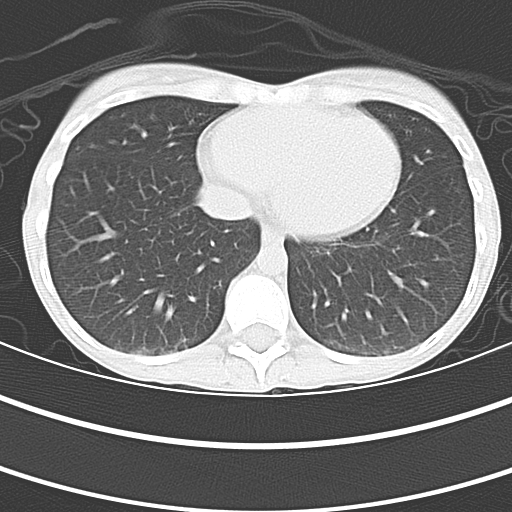

[12 of 14 positions shown; findings below may reference images not displayed]

FINDINGS: Bilateral nonobstructing renal calculi.  No evidence to
suggest renal collecting system obstruction.  No obvious ureteral
calculi.  Contracted urinary bladder without gross abnormality.

Evaluation of the bowel is limited secondary to lack of fat planes.
No obvious bowel inflammatory process, free fluid or free air.  The
appendix contains a small amount of radiopaque material without the
shape of an appendicolith.  No obvious inflammation of the appendix
however, the distal appendix blends in with adjacent right ovary
where a corpus luteum cyst is noted and evaluation at this level is
somewhat limited.

Evaluation of solid abdominal viscera is limited by lack of IV
contrast.  Taking this limitation into account no worrisome
hepatic, splenic, renal, adrenal or pancreatic abnormality.
Elongated right lobe of the liver spans over 18 cm.

Gas filled prominent sized proximal duodenum may be related to the
mild compression of the adjacent duodenum by vascular structures in
this thin patient.

Contracted gallbladder with out evidence of calcified gallstone.

No bony destructive lesion.

No abdominal aortic aneurysm.

Limited for detecting adenopathy given the lack of fat planes.
IMPRESSION: Bilateral nonobstructing renal calculi.  No evidence to suggest
renal collecting system obstruction.  No obvious ureteral calculi.

Evaluation of the bowel is limited secondary to lack of fat planes.
No obvious bowel inflammatory process, free fluid or free air.  The
appendix contains a small amount of radiopaque material without the
shape of an appendicolith.  No obvious inflammation of the appendix
however, the distal appendix blends in with adjacent right ovary
where a corpus luteum cyst is noted and evaluation at this level is
somewhat limited.

## 2016-10-21 ENCOUNTER — Encounter: Payer: Self-pay | Admitting: Emergency Medicine

## 2016-10-21 ENCOUNTER — Emergency Department
Admission: EM | Admit: 2016-10-21 | Discharge: 2016-10-21 | Disposition: A | Discharge status: Home / Self Care | Payer: Self-pay | Attending: Emergency Medicine | Admitting: Emergency Medicine

## 2016-10-21 DIAGNOSIS — J0100 Acute maxillary sinusitis, unspecified: Secondary | ICD-10-CM | POA: Insufficient documentation

## 2016-10-21 DIAGNOSIS — Z7982 Long term (current) use of aspirin: Secondary | ICD-10-CM | POA: Insufficient documentation

## 2016-10-21 DIAGNOSIS — F17210 Nicotine dependence, cigarettes, uncomplicated: Secondary | ICD-10-CM | POA: Insufficient documentation

## 2016-10-21 MED ORDER — BENZONATATE 100 MG PO CAPS: 100.0000 mg | ORAL_CAPSULE | Freq: Four times a day (QID) | ORAL | 0 refills | Status: AC | PRN

## 2016-10-21 MED ORDER — SULFAMETHOXAZOLE-TRIMETHOPRIM 800-160 MG PO TABS: 1.0000 | ORAL_TABLET | Freq: Two times a day (BID) | ORAL | 0 refills | Status: AC

## 2016-10-21 MED ORDER — FEXOFENADINE-PSEUDOEPHED ER 60-120 MG PO TB12: 1.0000 | ORAL_TABLET | Freq: Two times a day (BID) | ORAL | 0 refills | Status: AC

## 2016-10-21 NOTE — ED Provider Notes (Signed)
[facility] Emergency Department Provider Note   ____________________________________________   None    (approximate)  I have reviewed the triage vital signs and the nursing notes.   HISTORY  Chief Complaint Cough    HPI Hannah Bright is a 35 y.o. female patient complaining of frontal headache, sinus congestion, facial pressure, ear pressure, and cough. Patient states she's unsure of fever. Patient denies vomiting diarrhea this time.Patient rates pain as a 5/10. No palliative measures for complaint.   Past Medical History:  Diagnosis Date  . Infection    infection from rice.  . Kidney infection   . Kidney stones   . UTI (lower urinary tract infection)     There are no active problems to display for this patient.   Past Surgical History:  Procedure Laterality Date  . DENTAL SURGERY    . DILATION AND CURETTAGE OF UTERUS      Prior to Admission medications   Medication Sig Start Date End Date Taking? Authorizing Provider  Aspirin-Salicylamide-Caffeine (BC HEADACHE POWDER PO) Take 1 packet by mouth as needed (headache).    Historical Provider, MD  benzonatate (TESSALON PERLES) 100 MG capsule Take 1 capsule (100 mg total) by mouth every 6 (six) hours as needed for cough. 10/21/16 10/21/17  Joni Reiningonald K Smith, PA-C  ciprofloxacin (CIPRO) 500 MG tablet Take 1 tablet (500 mg total) by mouth every 12 (twelve) hours. Patient not taking: Reported on 11/02/2014 06/13/14   Charlestine Nighthristopher Lawyer, PA-C  fexofenadine-pseudoephedrine (ALLEGRA-D) 60-120 MG 12 hr tablet Take 1 tablet by mouth 2 (two) times daily. 10/21/16   Joni Reiningonald K Smith, PA-C  promethazine (PHENERGAN) 25 MG tablet Take 1 tablet (25 mg total) by mouth every 8 (eight) hours as needed for nausea or vomiting. Patient not taking: Reported on 11/02/2014 06/13/14   Charlestine Nighthristopher Lawyer, PA-C  sulfamethoxazole-trimethoprim (BACTRIM DS,SEPTRA DS) 800-160 MG tablet Take 1 tablet by mouth 2 (two) times  daily. 10/21/16   Joni Reiningonald K Smith, PA-C    Allergies Penicillins and Tramadol  Family History  Problem Relation Age of Onset  . Aneurysm Mother   . Heart failure Father     Social History Social History  Substance Use Topics  . Smoking status: Current Every Day Smoker    Packs/day: 0.50    Types: Cigarettes  . Smokeless tobacco: Never Used  . Alcohol use No    Review of Systems Constitutional: No fever/chills Eyes: No visual changes. ENT: Nasal and ear pressure. Cardiovascular: Denies chest pain. Respiratory: Denies shortness of breath. Nonproductive cough Gastrointestinal: No abdominal pain.  No nausea, no vomiting.  No diarrhea.  No constipation. Genitourinary: Negative for dysuria. Musculoskeletal: Negative for back pain. Skin: Negative for rash. Neurological: Negative for headaches, focal weakness or numbness.    ____________________________________________   PHYSICAL EXAM:  VITAL SIGNS: ED Triage Vitals [10/21/16 1417]  Enc Vitals Group     BP 113/77     Pulse Rate 100     Resp 20     Temp 98.2 F (36.8 C)     Temp Source Oral     SpO2 100 %     Weight 92 lb (41.7 kg)     Height 4\' 11"  (1.499 m)     Head Circumference      Peak Flow      Pain Score      Pain Loc      Pain Edu?      Excl. in GC?     Constitutional: Alert  and oriented. Well appearing and in no acute distress. Eyes: Conjunctivae are normal. PERRL. EOMI. Head: Atraumatic. Nose: Bilateral maxillary guarding. Edematous nasal turbinates. Mouth/Throat: Mucous membranes are moist.  Oropharynx non-erythematous. Postnasal drainage Neck: No stridor.  No cervical spine tenderness to palpation.* Cardiovascular: Normal rate, regular rhythm. Grossly normal heart sounds.  Good peripheral circulation. Respiratory: Normal respiratory effort.  No retractions. Lungs CTAB. Gastrointestinal: Soft and nontender. No distention. No abdominal bruits. No CVA tenderness. Musculoskeletal: No lower  extremity tenderness nor edema.  No joint effusions. Neurologic:  Normal speech and language. No gross focal neurologic deficits are appreciated. No gait instability. Skin:  Skin is warm, dry and intact. No rash noted. Psychiatric: Mood and affect are normal. Speech and behavior are normal.  ____________________________________________   LABS (all labs ordered are listed, but only abnormal results are displayed)  Labs Reviewed - No data to display ____________________________________________  EKG   ____________________________________________  RADIOLOGY   ____________________________________________   PROCEDURES  Procedure(s) performed: None  Procedures  Critical Care performed: No  ____________________________________________   INITIAL IMPRESSION / ASSESSMENT AND PLAN / ED COURSE  Pertinent labs & imaging results that were available during my care of the patient were reviewed by me and considered in my medical decision making (see chart for details).  Bactrim DS, Allegra-D, and Tessalon. Advised over-the-counter ibuprofen or Tylenol for headache. Patient advised to follow-up with "clinic if condition persists.  Clinical Course      ____________________________________________   FINAL CLINICAL IMPRESSION(S) / ED DIAGNOSES  Final diagnoses:  Subacute maxillary sinusitis      NEW MEDICATIONS STARTED DURING THIS VISIT:  New Prescriptions   BENZONATATE (TESSALON PERLES) 100 MG CAPSULE    Take 1 capsule (100 mg total) by mouth every 6 (six) hours as needed for cough.   FEXOFENADINE-PSEUDOEPHEDRINE (ALLEGRA-D) 60-120 MG 12 HR TABLET    Take 1 tablet by mouth 2 (two) times daily.   SULFAMETHOXAZOLE-TRIMETHOPRIM (BACTRIM DS,SEPTRA DS) 800-160 MG TABLET    Take 1 tablet by mouth 2 (two) times daily.     Note:  This document was prepared using Dragon voice recognition software and may include unintentional dictation errors.    Joni ReiningRonald K Smith, PA-C 10/21/16  1513    Nita Sicklearolina Veronese, MD 10/23/16 [phone]

## 2016-10-21 NOTE — ED Notes (Signed)
See triage note.  States she developed some sinus drainage with pressure and cough about 1 week ago  Unsure of fever

## 2016-10-21 NOTE — ED Triage Notes (Signed)
Pt with non productive cough for one week. Nad.

## 2016-12-27 ENCOUNTER — Emergency Department
Admission: EM | Admit: 2016-12-27 | Discharge: 2016-12-27 | Disposition: A | Discharge status: Home / Self Care | Payer: Self-pay | Attending: Emergency Medicine | Admitting: Emergency Medicine

## 2016-12-27 ENCOUNTER — Emergency Department: Payer: Self-pay

## 2016-12-27 ENCOUNTER — Encounter: Payer: Self-pay | Admitting: Emergency Medicine

## 2016-12-27 DIAGNOSIS — S20212A Contusion of left front wall of thorax, initial encounter: Secondary | ICD-10-CM | POA: Insufficient documentation

## 2016-12-27 DIAGNOSIS — W1800XA Striking against unspecified object with subsequent fall, initial encounter: Secondary | ICD-10-CM | POA: Insufficient documentation

## 2016-12-27 DIAGNOSIS — Y929 Unspecified place or not applicable: Secondary | ICD-10-CM | POA: Insufficient documentation

## 2016-12-27 DIAGNOSIS — J069 Acute upper respiratory infection, unspecified: Secondary | ICD-10-CM | POA: Insufficient documentation

## 2016-12-27 DIAGNOSIS — F17210 Nicotine dependence, cigarettes, uncomplicated: Secondary | ICD-10-CM | POA: Insufficient documentation

## 2016-12-27 DIAGNOSIS — Y9389 Activity, other specified: Secondary | ICD-10-CM | POA: Insufficient documentation

## 2016-12-27 DIAGNOSIS — Y990 Civilian activity done for income or pay: Secondary | ICD-10-CM | POA: Insufficient documentation

## 2016-12-27 MED ORDER — GUAIFENESIN-CODEINE 100-10 MG/5ML PO SOLN: 10.0000 mL | ORAL | 0 refills | Status: AC | PRN

## 2016-12-27 NOTE — Discharge Instructions (Signed)
Take ibuprofen 600mg  every 6 hours and your cough medication as prescribed.  Follow up with the primary care provider of your choice for symptoms that are not improving over the week or return to the ER for symptoms that change or worsen.

## 2016-12-27 NOTE — ED Notes (Signed)
NAD noted at time of D/C. Pt denies questions or concerns. Pt ambulatory to the lobby at this time.  

## 2016-12-27 NOTE — ED Notes (Signed)
Pt reports falling approx 1 week ago and landing on L ribs. Pt reports yesterday sneezed, and felt a popping sensation. Pt c/o pain with movement and deep inspiration. Pt is alert and oriented, noted to be crying during assessment. Will continue to monitor for further patient needs.

## 2016-12-27 NOTE — ED Provider Notes (Signed)
[facility] Emergency Department Provider Note ____________________________________________  Time seen: Approximately 7:07 AM  I have reviewed the triage vital signs and the nursing notes.   HISTORY  Chief Complaint Fall and Rib Injury    HPI Hannah Bright is a 36 y.o. female who presents to the emergency department for evaluation of left rib injury and cough. She fell at work about a week ago and injured her rib. She states she fell between joist and hit her left rib. Last night she coughed and felt something "move" with an increase in pain.She has not taken anything for pain.  Past Medical History:  Diagnosis Date  . Infection    infection from rice.  . Kidney infection   . Kidney stones   . UTI (lower urinary tract infection)     There are no active problems to display for this patient.   Past Surgical History:  Procedure Laterality Date  . DENTAL SURGERY    . DILATION AND CURETTAGE OF UTERUS      Prior to Admission medications   Medication Sig Start Date End Date Taking? Authorizing Provider  Aspirin-Salicylamide-Caffeine (BC HEADACHE POWDER PO) Take 1 packet by mouth as needed (headache).    Historical Provider, MD  benzonatate (TESSALON PERLES) 100 MG capsule Take 1 capsule (100 mg total) by mouth every 6 (six) hours as needed for cough. 10/21/16 10/21/17  Joni Reining, PA-C  ciprofloxacin (CIPRO) 500 MG tablet Take 1 tablet (500 mg total) by mouth every 12 (twelve) hours. Patient not taking: Reported on 11/02/2014 06/13/14   Charlestine Night, PA-C  fexofenadine-pseudoephedrine (ALLEGRA-D) 60-120 MG 12 hr tablet Take 1 tablet by mouth 2 (two) times daily. 10/21/16   Joni Reining, PA-C  guaiFENesin-codeine 100-10 MG/5ML syrup Take 10 mLs by mouth every 4 (four) hours as needed. 12/27/16   Chinita Pester, FNP  promethazine (PHENERGAN) 25 MG tablet Take 1 tablet (25 mg total) by mouth every 8 (eight) hours as needed for nausea or  vomiting. Patient not taking: Reported on 11/02/2014 06/13/14   Charlestine Night, PA-C  sulfamethoxazole-trimethoprim (BACTRIM DS,SEPTRA DS) 800-160 MG tablet Take 1 tablet by mouth 2 (two) times daily. 10/21/16   Joni Reining, PA-C    Allergies Penicillins and Tramadol  Family History  Problem Relation Age of Onset  . Aneurysm Mother   . Heart failure Father     Social History Social History  Substance Use Topics  . Smoking status: Current Every Day Smoker    Packs/day: 0.50    Types: Cigarettes  . Smokeless tobacco: Never Used  . Alcohol use No    Review of Systems Constitutional: No recent illness. Cardiovascular: Denies chest pain or palpitations. Respiratory: Denies shortness of breath. Positive for cough. Musculoskeletal: Pain in left rib. Skin: Negative for rash, wound, lesion. Neurological: Negative for focal weakness or numbness.  ____________________________________________   PHYSICAL EXAM:  VITAL SIGNS: ED Triage Vitals  Enc Vitals Group     BP 12/27/16 0609 107/73     Pulse Rate 12/27/16 0609 89     Resp 12/27/16 0609 18     Temp 12/27/16 0609 98 F (36.7 C)     Temp Source 12/27/16 0609 Oral     SpO2 12/27/16 0609 97 %     Weight 12/27/16 0610 92 lb (41.7 kg)     Height 12/27/16 0610 4\' 11"  (1.499 m)     Head Circumference --      Peak Flow --  Pain Score 12/27/16 0610 8     Pain Loc --      Pain Edu? --      Excl. in GC? --     Constitutional: Alert and oriented. Well appearing and in no acute distress. Eyes: Conjunctivae are normal. EOMI. Head: Atraumatic. Neck: No stridor.  Respiratory: Normal respiratory effort. Breath sounds clear to auscultation. Musculoskeletal: Tender to palpation over the left lateral and posterior thorax. Neurologic:  Normal speech and language. No gross focal neurologic deficits are appreciated. Speech is normal. No gait instability. Skin:  Skin is warm, dry and intact. Atraumatic. Psychiatric: Mood and  affect are normal. Speech and behavior are normal.  ____________________________________________   LABS (all labs ordered are listed, but only abnormal results are displayed)  Labs Reviewed - No data to display ____________________________________________  RADIOLOGY  Left rib and chest negative for acute abnormality per radiology. ____________________________________________   PROCEDURES  Procedure(s) performed: None   ____________________________________________   INITIAL IMPRESSION / ASSESSMENT AND PLAN / ED COURSE  36 year old female presenting to the emergency department for evaluation of cough and rib injury. No obvious fracture on x-ray. She will be advised to take ibuprofen every 6 hours and Robitussin AC as prescribed. She is to follow up with the primary care provider for symptoms that are not improving over the week or return to the ER for symptoms that change or worsen if unable to schedule an appointment.   Pertinent labs & imaging results that were available during my care of the patient were reviewed by me and considered in my medical decision making (see chart for details). ____________________________________________   FINAL CLINICAL IMPRESSION(S) / ED DIAGNOSES  Final diagnoses:  Rib contusion, left, initial encounter  Viral upper respiratory tract infection       Chinita PesterCari B Iya Hamed, FNP 12/27/16 0745    Sharman CheekPhillip Stafford, MD 12/31/16 [phone]

## 2016-12-27 NOTE — ED Triage Notes (Signed)
Pt to triage via WC, reports fall at work about 1 week ago, injury to left rib, reports last night coughed and felt something move, now increased pain to left ribs.

## 2018-03-04 ENCOUNTER — Encounter: Payer: Self-pay | Admitting: Emergency Medicine

## 2018-03-04 ENCOUNTER — Emergency Department
Admission: EM | Admit: 2018-03-04 | Discharge: 2018-03-04 | Disposition: A | Discharge status: Home / Self Care | Payer: Self-pay | Attending: Emergency Medicine | Admitting: Emergency Medicine

## 2018-03-04 ENCOUNTER — Emergency Department: Payer: Self-pay

## 2018-03-04 DIAGNOSIS — M65311 Trigger thumb, right thumb: Secondary | ICD-10-CM | POA: Insufficient documentation

## 2018-03-04 DIAGNOSIS — F17210 Nicotine dependence, cigarettes, uncomplicated: Secondary | ICD-10-CM | POA: Insufficient documentation

## 2018-03-04 DIAGNOSIS — Y929 Unspecified place or not applicable: Secondary | ICD-10-CM | POA: Insufficient documentation

## 2018-03-04 DIAGNOSIS — X509XXA Other and unspecified overexertion or strenuous movements or postures, initial encounter: Secondary | ICD-10-CM | POA: Insufficient documentation

## 2018-03-04 DIAGNOSIS — Y999 Unspecified external cause status: Secondary | ICD-10-CM | POA: Insufficient documentation

## 2018-03-04 DIAGNOSIS — Z79899 Other long term (current) drug therapy: Secondary | ICD-10-CM | POA: Insufficient documentation

## 2018-03-04 DIAGNOSIS — Y9389 Activity, other specified: Secondary | ICD-10-CM | POA: Insufficient documentation

## 2018-03-04 MED ORDER — PREDNISONE 10 MG PO TABS: 10.0000 mg | ORAL_TABLET | Freq: Every day | ORAL | 0 refills | Status: AC

## 2018-03-04 NOTE — ED Triage Notes (Signed)
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 Pt denies workers comp. No obvious deformity noted.

## 2018-03-04 NOTE — Discharge Instructions (Addendum)
Please take prednisone as prescribed.  Continue with thumb splint for 1 week.  If no improvement in 1 week call orthopedic office for evaluation.

## 2018-03-04 NOTE — ED Provider Notes (Signed)
[facility] EMERGENCY DEPARTMENT Provider Note   CSN: [id number] Arrival date & time: 03/04/18  1955     History   Chief Complaint Chief Complaint  Patient presents with  . Finger Injury    HPI Hannah Bright is a 37 y.o. female.  Complains of right thumb pain since February 2019.  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  She denies any significant trauma or concerns for fracture.  She states she did have a pain initially but she developed catching and clicking of the right thumb with A1 pulley.  She later developed locking sensation and now she is unable to flex the right thumb due to pain at the A1 pulley.  She denies any numbness or tingling.  She has been taking BC powders with no improvement.  She has not tried any splinting or immobilization.  HPI  Past Medical History:  Diagnosis Date  . Infection    infection from rice.  . Kidney infection   . Kidney stones   . UTI (lower urinary tract infection)     There are no active problems to display for this patient.   Past Surgical History:  Procedure Laterality Date  . DENTAL SURGERY    . DILATION AND CURETTAGE OF UTERUS       OB History   None      Home Medications    Prior to Admission medications   Medication Sig Start Date End Date Taking? Authorizing Provider  Aspirin-Salicylamide-Caffeine (BC HEADACHE POWDER PO) Take 1 packet by mouth as needed (headache).    [provider]  ciprofloxacin (CIPRO) 500 MG tablet Take 1 tablet (500 mg total) by mouth every 12 (twelve) hours. Patient not taking: Reported on 11/02/2014 06/13/14   Lawyer, Cristal Deer, PA-C  fexofenadine-pseudoephedrine (ALLEGRA-D) 60-120 MG 12 hr tablet Take 1 tablet by mouth 2 (two) times daily. 10/21/16   Joni Reining, PA-C  guaiFENesin-codeine 100-10 MG/5ML syrup Take 10 mLs by mouth every 4 (four) hours as needed. 12/27/16   Triplett, Rulon Eisenmenger B, FNP    predniSONE (DELTASONE) 10 MG tablet Take 1 tablet (10 mg total) by mouth daily. 6,5,4,3,2,1 six day taper 03/04/18   Evon Slack, PA-C  promethazine (PHENERGAN) 25 MG tablet Take 1 tablet (25 mg total) by mouth every 8 (eight) hours as needed for nausea or vomiting. Patient not taking: Reported on 11/02/2014 06/13/14   Charlestine Night, PA-C  sulfamethoxazole-trimethoprim (BACTRIM DS,SEPTRA DS) 800-160 MG tablet Take 1 tablet by mouth 2 (two) times daily. 10/21/16   Joni Reining, PA-C    Family History Family History  Problem Relation Age of Onset  . Aneurysm Mother   . Heart failure Father     Social History Social History   Tobacco Use  . Smoking status: Current Every Day Smoker    Packs/day: 0.50    Types: Cigarettes  . Smokeless tobacco: Never Used  Substance Use Topics  . Alcohol use: No  . Drug use: No     Allergies   Penicillins and Tramadol   Review of Systems Review of Systems  Musculoskeletal: Positive for arthralgias. Negative for joint swelling, myalgias and neck pain.  Skin: Negative for rash and wound.  Neurological: Negative for numbness.     Physical Exam Updated Vital Signs BP (!) 124/95 (BP Location: Right Arm)   Pulse 98   Temp 97.7 F (36.5 C) (Oral)   Resp 17   Wt 41.7 kg (92  lb)   SpO2 99%   BMI 18.58 kg/m   Physical Exam  Constitutional: She is oriented to person, place, and time. She appears well-developed and well-nourished.  HENT:  Head: Normocephalic and atraumatic.  Eyes: Conjunctivae are normal.  Neck: Normal range of motion.  Cardiovascular: Normal rate.  Pulmonary/Chest: Effort normal. No respiratory distress.  Musculoskeletal:  Examination of the right thumb shows limited flexion secondary to pain with active flexion.  She has catching noted over the A1 pulley with attempted active range of motion.  She is nontender along the [facility] joint.  She is neurovascular intact right upper extremity.  Negative Tinel's and Phalen  sign.  Neurological: She is alert and oriented to person, place, and time.  Skin: Skin is warm. No rash noted.  Psychiatric: She has a normal mood and affect. Her behavior is normal. Thought content normal.     ED Treatments / Results  Labs (all labs ordered are listed, but only abnormal results are displayed) Labs Reviewed - No data to display  EKG None  Radiology Dg Hand Complete Right  Result Date: 03/04/2018 CLINICAL DATA:  Right thumb and wrist pain since February injured while painting. EXAM: RIGHT HAND - COMPLETE 3+ VIEW COMPARISON:  01/16/2010 FINDINGS: Remote distal fifth metacarpal fracture with healing. No acute osseous abnormality. No bony erosive change or periostitis. No malalignment. Soft tissues are unremarkable. IMPRESSION: Remote fifth metacarpal fracture unchanged in appearance. No acute osseous abnormality. Electronically Signed   By: Tollie Eth M.D.   On: 03/04/2018 20:25    Procedures Procedures (including critical care time)  Medications Ordered in ED Medications - No data to display   Initial Impression / Assessment and Plan / ED Course  I have reviewed the triage vital signs and the nursing notes.  Pertinent labs & imaging results that were available during my care of the patient were reviewed by me and considered in my medical decision making (see chart for details).     37 year old female with right trigger thumb that is been present since February.  Initially having catching and triggering but now thumb will not flex.  She is tender over the A1 pulley.  There is palpable catching noted with active and passive range of motion.  She is placed into a thumb spica splint and given a 6-day steroid taper.  She will follow-up with orthopedics if no improvement in 1 week.  Final Clinical Impressions(s) / ED Diagnoses   Final diagnoses:  Trigger finger of right thumb    ED Discharge Orders        Ordered    predniSONE (DELTASONE) 10 MG tablet  Daily      03/04/18 2051       Ronnette Juniper 03/04/18 2106    Myrna Blazer, MD 03/04/18 2251

## 2018-10-03 ENCOUNTER — Emergency Department: Payer: Self-pay

## 2018-10-03 ENCOUNTER — Other Ambulatory Visit: Payer: Self-pay

## 2018-10-03 ENCOUNTER — Emergency Department
Admission: EM | Admit: 2018-10-03 | Discharge: 2018-10-03 | Disposition: A | Discharge status: Home / Self Care | Payer: Self-pay | Attending: Emergency Medicine | Admitting: Emergency Medicine

## 2018-10-03 ENCOUNTER — Encounter: Payer: Self-pay | Admitting: Emergency Medicine

## 2018-10-03 DIAGNOSIS — R110 Nausea: Secondary | ICD-10-CM | POA: Insufficient documentation

## 2018-10-03 DIAGNOSIS — Z5321 Procedure and treatment not carried out due to patient leaving prior to being seen by health care provider: Secondary | ICD-10-CM | POA: Insufficient documentation

## 2018-10-03 DIAGNOSIS — R079 Chest pain, unspecified: Secondary | ICD-10-CM | POA: Insufficient documentation

## 2018-10-03 LAB — CBC
HEMATOCRIT: 42.9 % (ref 36.0–46.0)
HEMOGLOBIN: 14.3 g/dL (ref 12.0–15.0)
MCH: 28.7 pg (ref 26.0–34.0)
MCHC: 33.3 g/dL (ref 30.0–36.0)
MCV: 86.1 fL (ref 80.0–100.0)
Platelets: 348 10*3/uL (ref 150–400)
RBC: 4.98 MIL/uL (ref 3.87–5.11)
RDW: 12.8 % (ref 11.5–15.5)
WBC: 8.8 10*3/uL (ref 4.0–10.5)
nRBC: 0 % (ref 0.0–0.2)

## 2018-10-03 LAB — BASIC METABOLIC PANEL
Anion gap: 11 (ref 5–15)
BUN: 26 mg/dL — AB (ref 6–20)
CHLORIDE: 100 mmol/L (ref 98–111)
CO2: 25 mmol/L (ref 22–32)
Calcium: 9.2 mg/dL (ref 8.9–10.3)
Creatinine, Ser: 0.35 mg/dL — ABNORMAL LOW (ref 0.44–1.00)
GFR calc Af Amer: >60 mL/min (ref >60)
GFR calc non Af Amer: >60 mL/min (ref >60)
GLUCOSE: 100 mg/dL — AB (ref 70–99)
POTASSIUM: 3.9 mmol/L (ref 3.5–5.1)
Sodium: 136 mmol/L (ref 135–145)

## 2018-10-03 LAB — TROPONIN I: Troponin I: <0.03 ng/mL (ref <0.03)

## 2018-10-03 NOTE — ED Notes (Signed)
Xray has attempted x 4 to get patient. Not in waiting room. Patient continues not in wr.

## 2018-10-03 NOTE — ED Triage Notes (Signed)
Pt to ED via POV c/o chest pain that started about 1 hour PTA. Pt states that she was at work when the chest pain started. Pain is located in the left side of the chest and does not radiate. Pt states that the pain is a constant dull pain but becomes sharp before easing back off. Pt states that she has had nausea but no vomiting. Pt denies shortness of breath or diaphoresis. Pt is currently in NAD at this time. Pt denies cardiac hx.

## 2018-12-10 ENCOUNTER — Encounter: Payer: Self-pay | Admitting: Emergency Medicine

## 2018-12-10 DIAGNOSIS — Z79899 Other long term (current) drug therapy: Secondary | ICD-10-CM | POA: Insufficient documentation

## 2018-12-10 DIAGNOSIS — N3000 Acute cystitis without hematuria: Secondary | ICD-10-CM | POA: Insufficient documentation

## 2018-12-10 DIAGNOSIS — Q453 Other congenital malformations of pancreas and pancreatic duct: Secondary | ICD-10-CM | POA: Insufficient documentation

## 2018-12-10 DIAGNOSIS — F17210 Nicotine dependence, cigarettes, uncomplicated: Secondary | ICD-10-CM | POA: Insufficient documentation

## 2018-12-10 LAB — URINALYSIS, COMPLETE (UACMP) WITH MICROSCOPIC
Bilirubin Urine: NEGATIVE
GLUCOSE, UA: NEGATIVE mg/dL
Hgb urine dipstick: NEGATIVE
KETONES UR: NEGATIVE mg/dL
Nitrite: POSITIVE — AB
PROTEIN: NEGATIVE mg/dL
Specific Gravity, Urine: 1.025 (ref 1.005–1.030)
pH: 6 (ref 5.0–8.0)

## 2018-12-10 LAB — CBC
HCT: 43.6 % (ref 36.0–46.0)
HEMOGLOBIN: 14.3 g/dL (ref 12.0–15.0)
MCH: 29 pg (ref 26.0–34.0)
MCHC: 32.8 g/dL (ref 30.0–36.0)
MCV: 88.4 fL (ref 80.0–100.0)
Platelets: 331 10*3/uL (ref 150–400)
RBC: 4.93 MIL/uL (ref 3.87–5.11)
RDW: 12.9 % (ref 11.5–15.5)
WBC: 8.8 10*3/uL (ref 4.0–10.5)
nRBC: 0 % (ref 0.0–0.2)

## 2018-12-10 LAB — COMPREHENSIVE METABOLIC PANEL
ALT: 13 U/L (ref 0–44)
ANION GAP: 6 (ref 5–15)
AST: 17 U/L (ref 15–41)
Albumin: 4.2 g/dL (ref 3.5–5.0)
Alkaline Phosphatase: 55 U/L (ref 38–126)
BUN: 30 mg/dL — ABNORMAL HIGH (ref 6–20)
CHLORIDE: 105 mmol/L (ref 98–111)
CO2: 25 mmol/L (ref 22–32)
Calcium: 9 mg/dL (ref 8.9–10.3)
Creatinine, Ser: 0.59 mg/dL (ref 0.44–1.00)
GFR calc Af Amer: >60 mL/min (ref >60)
GFR calc non Af Amer: >60 mL/min (ref >60)
Glucose, Bld: 104 mg/dL — ABNORMAL HIGH (ref 70–99)
POTASSIUM: 4.6 mmol/L (ref 3.5–5.1)
SODIUM: 136 mmol/L (ref 135–145)
Total Bilirubin: 0.5 mg/dL (ref 0.3–1.2)
Total Protein: 7.5 g/dL (ref 6.5–8.1)

## 2018-12-10 LAB — LIPASE, BLOOD: Lipase: 26 U/L (ref 11–51)

## 2018-12-10 LAB — TROPONIN I: Troponin I: <0.03 ng/mL (ref <0.03)

## 2018-12-10 LAB — POCT PREGNANCY, URINE: Preg Test, Ur: NEGATIVE

## 2018-12-10 MED ORDER — SODIUM CHLORIDE 0.9% FLUSH
3.0000 mL | Freq: Once | INTRAVENOUS | Status: AC
  Administered 2018-12-11: 3 mL via INTRAVENOUS

## 2018-12-10 NOTE — ED Triage Notes (Signed)
Pt c/o upper epigastric abdominal pain x1 weeks ago, accompanied by N/V. Pt denies urinary symptoms.

## 2018-12-11 ENCOUNTER — Emergency Department: Payer: Self-pay

## 2018-12-11 ENCOUNTER — Emergency Department
Admission: EM | Admit: 2018-12-11 | Discharge: 2018-12-11 | Disposition: A | Discharge status: Home / Self Care | Payer: Self-pay | Attending: Emergency Medicine | Admitting: Emergency Medicine

## 2018-12-11 DIAGNOSIS — Q453 Other congenital malformations of pancreas and pancreatic duct: Secondary | ICD-10-CM

## 2018-12-11 DIAGNOSIS — N3000 Acute cystitis without hematuria: Secondary | ICD-10-CM

## 2018-12-11 DIAGNOSIS — R1011 Right upper quadrant pain: Secondary | ICD-10-CM

## 2018-12-11 MED ORDER — MORPHINE SULFATE (PF) 2 MG/ML IV SOLN
2.0000 mg | Freq: Once | INTRAVENOUS | Status: AC
  Administered 2018-12-11: 2 mg via INTRAVENOUS
  Filled 2018-12-11: qty 1

## 2018-12-11 MED ORDER — SODIUM CHLORIDE 0.9 % IV BOLUS
1000.0000 mL | Freq: Once | INTRAVENOUS | Status: AC
  Administered 2018-12-11: 1000 mL via INTRAVENOUS

## 2018-12-11 MED ORDER — IOPAMIDOL (ISOVUE-300) INJECTION 61%
80.0000 mL | Freq: Once | INTRAVENOUS | Status: AC | PRN
  Administered 2018-12-11: 80 mL via INTRAVENOUS

## 2018-12-11 MED ORDER — ONDANSETRON HCL 4 MG/2ML IJ SOLN
4.0000 mg | Freq: Once | INTRAMUSCULAR | Status: AC
  Administered 2018-12-11: 4 mg via INTRAVENOUS
  Filled 2018-12-11: qty 2

## 2018-12-11 MED ORDER — NITROFURANTOIN MONOHYD MACRO 100 MG PO CAPS: 100.0000 mg | ORAL_CAPSULE | Freq: Two times a day (BID) | ORAL | 0 refills | Status: AC

## 2018-12-11 NOTE — ED Notes (Signed)
Patient transported to CT 

## 2018-12-11 NOTE — ED Provider Notes (Signed)
[facility] Emergency Department Provider Note _________________   First MD Initiated Contact with Patient 12/11/18 [phone]     (approximate)  I have reviewed the triage vital signs and the nursing notes.   HISTORY  Chief Complaint Abdominal Pain    HPI Hannah Bright is a 38 y.o. female Modena JanskyZentz to the emergency department with a one-week history of epigastric abdominal pain that is currently 10 out of 10 with associated nausea and vomiting.  Patient denies any diarrhea no constipation.  Patient denies any urinary symptoms.  Patient denies any fever.   Past Medical History:  Diagnosis Date  . Infection    infection from rice.  . Kidney infection   . Kidney stones   . UTI (lower urinary tract infection)     There are no active problems to display for this patient.   Past Surgical History:  Procedure Laterality Date  . DENTAL SURGERY    . DILATION AND CURETTAGE OF UTERUS      Prior to Admission medications   Medication Sig Start Date End Date Taking? Authorizing Provider  Aspirin-Salicylamide-Caffeine (BC HEADACHE POWDER PO) Take 1 packet by mouth as needed (headache).    [provider]  ciprofloxacin (CIPRO) 500 MG tablet Take 1 tablet (500 mg total) by mouth every 12 (twelve) hours. Patient not taking: Reported on 11/02/2014 06/13/14   Lawyer, Cristal Deerhristopher, PA-C  fexofenadine-pseudoephedrine (ALLEGRA-D) 60-120 MG 12 hr tablet Take 1 tablet by mouth 2 (two) times daily. 10/21/16   Joni ReiningSmith, Ronald K, PA-C  guaiFENesin-codeine 100-10 MG/5ML syrup Take 10 mLs by mouth every 4 (four) hours as needed. 12/27/16   Triplett, Rulon Eisenmengerari B, FNP  predniSONE (DELTASONE) 10 MG tablet Take 1 tablet (10 mg total) by mouth daily. 6,5,4,3,2,1 six day taper 03/04/18   Evon SlackGaines, Thomas C, PA-C  promethazine (PHENERGAN) 25 MG tablet Take 1 tablet (25 mg total) by mouth every 8 (eight) hours as needed for nausea or vomiting. Patient not taking: Reported on 11/02/2014  06/13/14   Charlestine NightLawyer, Christopher, PA-C  sulfamethoxazole-trimethoprim (BACTRIM DS,SEPTRA DS) 800-160 MG tablet Take 1 tablet by mouth 2 (two) times daily. 10/21/16   Joni ReiningSmith, Ronald K, PA-C    Allergies Penicillins and Tramadol  Family History  Problem Relation Age of Onset  . Aneurysm Mother   . Heart failure Father     Social History Social History   Tobacco Use  . Smoking status: Current Every Day Smoker    Packs/day: 0.50    Types: Cigarettes  . Smokeless tobacco: Never Used  Substance Use Topics  . Alcohol use: No  . Drug use: No    Review of Systems Constitutional: No fever/chills Eyes: No visual changes. ENT: No sore throat. Cardiovascular: Denies chest pain. Respiratory: Denies shortness of breath. Gastrointestinal: Positive for abdominal pain nausea and vomiting..  No diarrhea.  No constipation. Genitourinary: Negative for dysuria. Musculoskeletal: Negative for neck pain.  Negative for back pain. Integumentary: Negative for rash. Neurological: Negative for headaches, focal weakness or numbness.  ____________________________________________   PHYSICAL EXAM:  VITAL SIGNS: ED Triage Vitals  Enc Vitals Group     BP 12/10/18 2050 124/78     Pulse Rate 12/10/18 2050 91     Resp 12/10/18 2050 18     Temp 12/10/18 2050 98 F (36.7 C)     Temp Source 12/10/18 2050 Oral     SpO2 12/10/18 2050 99 %     Weight 12/10/18 2050 40.8 kg (90 lb)  Height 12/10/18 2050 1.499 m (4\' 11" )     Head Circumference --      Peak Flow --      Pain Score 12/11/18 0300 8     Pain Loc --      Pain Edu? --      Excl. in GC? --     Constitutional: Alert and oriented.  Apparent discomfort  eyes: Conjunctivae are normal.  Mouth/Throat: Mucous membranes are moist. Oropharynx non-erythematous. Neck: No stridor.  Cardiovascular: Normal rate, regular rhythm. Good peripheral circulation. Grossly normal heart sounds. Respiratory: Normal respiratory effort.  No retractions. Lungs  CTAB. Gastrointestinal: Epigastric tenderness to palpation.. No distention.  Musculoskeletal: No lower extremity tenderness nor edema. No gross deformities of extremities. Neurologic:  Normal speech and language. No gross focal neurologic deficits are appreciated.  Skin:  Skin is warm, dry and intact. No rash noted. Psychiatric: Mood and affect are normal. Speech and behavior are normal.  ____________________________________________   LABS (all labs ordered are listed, but only abnormal results are displayed)  Labs Reviewed  COMPREHENSIVE METABOLIC PANEL - Abnormal; Notable for the following components:      Result Value   Glucose, Bld 104 (*)    BUN 30 (*)    All other components within normal limits  URINALYSIS, COMPLETE (UACMP) WITH MICROSCOPIC - Abnormal; Notable for the following components:   Color, Urine AMBER (*)    APPearance CLOUDY (*)    Nitrite POSITIVE (*)    Leukocytes, UA MODERATE (*)    Bacteria, UA RARE (*)    All other components within normal limits  LIPASE, BLOOD  CBC  TROPONIN I  POC URINE PREG, ED  POCT PREGNANCY, URINE   ____________________________________________  EKG  ED ECG REPORT I, [place] N Dallen Bunte, the attending physician, personally viewed and interpreted this ECG.   Date: 12/10/2018  EKG Time: 8:51 PM  Rate: 78  Rhythm: Normal sinus rhythm  Axis: Normal  Intervals: Normal  ST&T Change: None  ____________________________________________  RADIOLOGY I,  N Vyctoria Dickman, personally viewed and evaluated these images (plain radiographs) as part of my medical decision making, as well as reviewing the written report by the radiologist.  ED MD interpretation: No acute findings noted on ultrasound of the right upper quadrant.  CT revealed pancreatic divisum but no acute findings no evidence of pancreatitis.  Official radiology report(s): Ct Abdomen Pelvis W Contrast  Result Date: 12/11/2018 CLINICAL DATA:  Acute abdominal pain EXAM: CT  ABDOMEN AND PELVIS WITH CONTRAST TECHNIQUE: Multidetector CT imaging of the abdomen and pelvis was performed using the standard protocol following bolus administration of intravenous contrast. CONTRAST:  80mL ISOVUE-300 IOPAMIDOL (ISOVUE-300) INJECTION 61% COMPARISON:  06/13/2014 FINDINGS: Lower chest:  No acute finding Hepatobiliary: No focal abnormality when accounting for incidental perfusion anomaly.No evidence of biliary obstruction or stone. Pancreas: Pancreas divisum.  No pancreatitis. Spleen: Unremarkable. Adrenals/Urinary Tract: Negative adrenals. No hydronephrosis or ureteral stone. Punctate right renal calculi. Right more than left upper pole renal scarring. Unremarkable bladder. Stomach/Bowel:  No obstruction. No appendicitis. Vascular/Lymphatic: No acute vascular abnormality. Dilated ovarian vessels with reflux. Mild atherosclerotic calcification at the proximal iliacs, age advanced. No mass or adenopathy. Reproductive:As above Other: No ascites or pneumoperitoneum. Musculoskeletal: No acute abnormalities. IMPRESSION: 1. No acute finding or change from 2015. 2. Chronic findings are described above. Electronically Signed   By: Marnee SpringJonathon  Watts M.D.   On: 12/11/2018 05:47   [place] Abdomen Limited Ruq  Result Date: 12/11/2018 CLINICAL DATA:  Initial evaluation for acute  right upper quadrant pain. EXAM: ULTRASOUND ABDOMEN LIMITED RIGHT UPPER QUADRANT COMPARISON:  Prior CT from 06/13/2014 FINDINGS: Gallbladder: No gallstones or wall thickening visualized. No sonographic Murphy sign noted by sonographer. Common bile duct: Diameter: 3.7 mm Liver: No focal lesion identified. Within normal limits in parenchymal echogenicity. Portal vein is patent on color Doppler imaging with normal direction of blood flow towards the liver. IMPRESSION: Normal right upper quadrant ultrasound. Electronically Signed   By: Rise Mu M.D.   On: 12/11/2018 05:03       Procedures   ____________________________________________   INITIAL IMPRESSION / ASSESSMENT AND PLAN / ED COURSE  As part of my medical decision making, I reviewed the following data within the electronic MEDICAL RECORD NUMBER 38 year old female present with above-stated history and physical exam secondary to epigastric abdominal pain.  Consider possibility of pancreatitis however lipase normal.  Also considered possibility of gallstones given right upper quadrant discomfort with palpation.  Right upper quadrant ultrasound revealed no evidence of cholelithiasis or cholecystitis.  CT scan of the abdomen performed revealed pancreatic divisum however no evidence of pancreatitis and no other acute pathology.  Patient also noted to have a urinary tract infection on urinalysis and as such patient was given Macrobid.  Patient advised to follow-up with primary care provider for further outpatient evaluation. ____________________________________________  FINAL CLINICAL IMPRESSION(S) / ED DIAGNOSES  Final diagnoses:  RUQ pain  Acute cystitis without hematuria  Pancreatic divisum     MEDICATIONS GIVEN DURING THIS VISIT:  Medications  sodium chloride flush (NS) 0.9 % injection 3 mL (3 mLs Intravenous Given 12/11/18 0304)  morphine 2 MG/ML injection 2 mg (2 mg Intravenous Given 12/11/18 0303)  ondansetron (ZOFRAN) injection 4 mg (4 mg Intravenous Given 12/11/18 0303)  sodium chloride 0.9 % bolus 1,000 mL (0 mLs Intravenous Stopped 12/11/18 0418)  morphine 2 MG/ML injection 2 mg (2 mg Intravenous Given 12/11/18 0513)  iopamidol (ISOVUE-300) 61 % injection 80 mL (80 mLs Intravenous Contrast Given 12/11/18 0523)     ED Discharge Orders    None       Note:  This document was prepared using Dragon voice recognition software and may include unintentional dictation errors.   Darci Current, MD 12/11/18 [phone]

## 2021-09-01 ENCOUNTER — Emergency Department: Payer: 59

## 2021-09-01 ENCOUNTER — Other Ambulatory Visit: Payer: Self-pay

## 2021-09-01 ENCOUNTER — Emergency Department
Admission: EM | Admit: 2021-09-01 | Discharge: 2021-09-01 | Disposition: A | Discharge status: Home / Self Care | Payer: 59 | Attending: Emergency Medicine | Admitting: Emergency Medicine

## 2021-09-01 ENCOUNTER — Encounter: Payer: Self-pay | Admitting: Emergency Medicine

## 2021-09-01 DIAGNOSIS — R0602 Shortness of breath: Secondary | ICD-10-CM | POA: Diagnosis present

## 2021-09-01 DIAGNOSIS — Z20822 Contact with and (suspected) exposure to covid-19: Secondary | ICD-10-CM | POA: Insufficient documentation

## 2021-09-01 DIAGNOSIS — Z7982 Long term (current) use of aspirin: Secondary | ICD-10-CM | POA: Diagnosis not present

## 2021-09-01 DIAGNOSIS — F17210 Nicotine dependence, cigarettes, uncomplicated: Secondary | ICD-10-CM | POA: Insufficient documentation

## 2021-09-01 DIAGNOSIS — Z79899 Other long term (current) drug therapy: Secondary | ICD-10-CM | POA: Insufficient documentation

## 2021-09-01 DIAGNOSIS — J101 Influenza due to other identified influenza virus with other respiratory manifestations: Secondary | ICD-10-CM | POA: Diagnosis not present

## 2021-09-01 LAB — CBC WITH DIFFERENTIAL/PLATELET
Abs Immature Granulocytes: 0.01 10*3/uL (ref 0.00–0.07)
Basophils Absolute: 0 10*3/uL (ref 0.0–0.1)
Basophils Relative: 1 %
Eosinophils Absolute: 0 10*3/uL (ref 0.0–0.5)
Eosinophils Relative: 0 %
HCT: 48.4 % — ABNORMAL HIGH (ref 36.0–46.0)
Hemoglobin: 17.5 g/dL — ABNORMAL HIGH (ref 12.0–15.0)
Immature Granulocytes: 0 %
Lymphocytes Relative: 19 %
Lymphs Abs: 0.8 10*3/uL (ref 0.7–4.0)
MCH: 30.6 pg (ref 26.0–34.0)
MCHC: 36.2 g/dL — ABNORMAL HIGH (ref 30.0–36.0)
MCV: 84.6 fL (ref 80.0–100.0)
Monocytes Absolute: 0.4 10*3/uL (ref 0.1–1.0)
Monocytes Relative: 10 %
Neutro Abs: 3.1 10*3/uL (ref 1.7–7.7)
Neutrophils Relative %: 70 %
Platelets: 172 10*3/uL (ref 150–400)
RBC: 5.72 MIL/uL — ABNORMAL HIGH (ref 3.87–5.11)
RDW: 12.3 % (ref 11.5–15.5)
WBC: 4.4 10*3/uL (ref 4.0–10.5)
nRBC: 0 % (ref 0.0–0.2)

## 2021-09-01 LAB — COMPREHENSIVE METABOLIC PANEL
ALT: 34 U/L (ref 0–44)
AST: 62 U/L — ABNORMAL HIGH (ref 15–41)
Albumin: 4.2 g/dL (ref 3.5–5.0)
Alkaline Phosphatase: 52 U/L (ref 38–126)
Anion gap: 14 (ref 5–15)
BUN: 22 mg/dL — ABNORMAL HIGH (ref 6–20)
CO2: 21 mmol/L — ABNORMAL LOW (ref 22–32)
Calcium: 9.1 mg/dL (ref 8.9–10.3)
Chloride: 97 mmol/L — ABNORMAL LOW (ref 98–111)
Creatinine, Ser: 0.56 mg/dL (ref 0.44–1.00)
GFR, Estimated: >60 mL/min (ref >60)
Glucose, Bld: 120 mg/dL — ABNORMAL HIGH (ref 70–99)
Potassium: 3.5 mmol/L (ref 3.5–5.1)
Sodium: 132 mmol/L — ABNORMAL LOW (ref 135–145)
Total Bilirubin: 0.7 mg/dL (ref 0.3–1.2)
Total Protein: 7.9 g/dL (ref 6.5–8.1)

## 2021-09-01 LAB — URINALYSIS, ROUTINE W REFLEX MICROSCOPIC
Bilirubin Urine: NEGATIVE
Glucose, UA: NEGATIVE mg/dL
Ketones, ur: 5 mg/dL — AB
Leukocytes,Ua: NEGATIVE
Nitrite: POSITIVE — AB
Protein, ur: 100 mg/dL — AB
Specific Gravity, Urine: 1.012 (ref 1.005–1.030)
pH: 5 (ref 5.0–8.0)

## 2021-09-01 LAB — RESP PANEL BY RT-PCR (FLU A&B, COVID) ARPGX2
Influenza A by PCR: POSITIVE — AB
Influenza B by PCR: NEGATIVE
SARS Coronavirus 2 by RT PCR: NEGATIVE

## 2021-09-01 LAB — LACTIC ACID, PLASMA
Lactic Acid, Venous: 2.3 mmol/L (ref 0.5–1.9)
Lactic Acid, Venous: 2.3 mmol/L (ref 0.5–1.9)

## 2021-09-01 LAB — D-DIMER, QUANTITATIVE: D-Dimer, Quant: 0.86 ug/mL-FEU — ABNORMAL HIGH (ref 0.00–0.50)

## 2021-09-01 LAB — POC URINE PREG, ED: Preg Test, Ur: NEGATIVE

## 2021-09-01 MED ORDER — IPRATROPIUM-ALBUTEROL 0.5-2.5 (3) MG/3ML IN SOLN
3.0000 mL | Freq: Once | RESPIRATORY_TRACT | Status: AC
  Administered 2021-09-01: 3 mL via RESPIRATORY_TRACT
  Filled 2021-09-01: qty 3

## 2021-09-01 MED ORDER — PREDNISONE 50 MG PO TABS: 50.0000 mg | ORAL_TABLET | Freq: Every day | ORAL | 0 refills | Status: AC

## 2021-09-01 MED ORDER — METHYLPREDNISOLONE SODIUM SUCC 125 MG IJ SOLR
125.0000 mg | Freq: Once | INTRAMUSCULAR | Status: AC
  Administered 2021-09-01: 125 mg via INTRAVENOUS
  Filled 2021-09-01: qty 2

## 2021-09-01 MED ORDER — ALBUTEROL SULFATE HFA 108 (90 BASE) MCG/ACT IN AERS: 2.0000 | INHALATION_SPRAY | Freq: Four times a day (QID) | RESPIRATORY_TRACT | 2 refills | Status: AC | PRN

## 2021-09-01 MED ORDER — LACTATED RINGERS IV BOLUS
1000.0000 mL | Freq: Once | INTRAVENOUS | Status: AC
  Administered 2021-09-01: 1000 mL via INTRAVENOUS

## 2021-09-01 MED ORDER — ACETAMINOPHEN 500 MG PO TABS
1000.0000 mg | ORAL_TABLET | Freq: Once | ORAL | Status: AC
  Administered 2021-09-01: 1000 mg via ORAL
  Filled 2021-09-01: qty 2

## 2021-09-01 MED ORDER — IOHEXOL 350 MG/ML SOLN
75.0000 mL | Freq: Once | INTRAVENOUS | Status: AC | PRN
  Administered 2021-09-01: 75 mL via INTRAVENOUS

## 2021-09-01 NOTE — ED Triage Notes (Signed)
Pt via POV from home. Pt states she had a sore throat on Thursday and since then she has been more SOB than normal. Pt also endorses nasal congestion and cough. Denies any other pain. Denies hx of COPD but does smoke approx 1 pack a day. Pt is A&Ox4 but pt is hypoxic and tachypneic

## 2021-09-01 NOTE — ED Provider Notes (Signed)
[facility] Emergency Department Provider Note ____________________________________________   Event Date/Time   First MD Initiated Contact with Patient 09/01/21 1741     (approximate)  I have reviewed the triage vital signs and the nursing notes.  HISTORY  Chief Complaint Shortness of Breath   HPI Hannah Bright is a 40 y.o. femalewho presents to the ED for evaluation of SOB.   Chart review indicates minimal medical hx.  She reports lifelong smoking history, 1/2-1 PPD.  Patient presents to the ED for evaluation of 1 week of sore throat, now resolving and progressing to shortness of breath, nasal congestion and nonproductive cough.  She reports intermittent fevers at home, up to 102 F.  Reports no syncope or chest pain, abdominal pain, emesis or dysuria.  No pain right now, just reports feeling tired, generalized weakness and generalized myalgias.  Past Medical History:  Diagnosis Date   Infection    infection from rice.   Kidney infection    Kidney stones    UTI (lower urinary tract infection)     There are no problems to display for this patient.   Past Surgical History:  Procedure Laterality Date   DENTAL SURGERY     DILATION AND CURETTAGE OF UTERUS      Prior to Admission medications   Medication Sig Start Date End Date Taking? Authorizing Provider  albuterol (VENTOLIN HFA) 108 (90 Base) MCG/ACT inhaler Inhale 2 puffs into the lungs every 6 (six) hours as needed for wheezing or shortness of breath. 09/01/21  Yes Delton Prairie, MD  predniSONE (DELTASONE) 50 MG tablet Take 1 tablet (50 mg total) by mouth daily for 4 days. 09/01/21 09/05/21 Yes Delton Prairie, MD  Aspirin-Salicylamide-Caffeine [facility] HEADACHE POWDER PO) Take 1 packet by mouth as needed (headache).    [provider]  ciprofloxacin (CIPRO) 500 MG tablet Take 1 tablet (500 mg total) by mouth every 12 (twelve) hours. Patient not taking: Reported on 11/02/2014 06/13/14    Lawyer, Cristal Deer, PA-C  fexofenadine-pseudoephedrine (ALLEGRA-D) 60-120 MG 12 hr tablet Take 1 tablet by mouth 2 (two) times daily. 10/21/16   Joni Reining, PA-C  guaiFENesin-codeine 100-10 MG/5ML syrup Take 10 mLs by mouth every 4 (four) hours as needed. 12/27/16   Triplett, Rulon Eisenmenger B, FNP  predniSONE (DELTASONE) 10 MG tablet Take 1 tablet (10 mg total) by mouth daily. 6,5,4,3,2,1 six day taper 03/04/18   Evon Slack, PA-C  promethazine (PHENERGAN) 25 MG tablet Take 1 tablet (25 mg total) by mouth every 8 (eight) hours as needed for nausea or vomiting. Patient not taking: Reported on 11/02/2014 06/13/14   Charlestine Night, PA-C  sulfamethoxazole-trimethoprim (BACTRIM DS,SEPTRA DS) 800-160 MG tablet Take 1 tablet by mouth 2 (two) times daily. 10/21/16   Joni Reining, PA-C    Allergies Penicillins and Tramadol  Family History  Problem Relation Age of Onset   Aneurysm Mother    Heart failure Father     Social History Social History   Tobacco Use   Smoking status: Every Day    Packs/day: 0.50    Types: Cigarettes   Smokeless tobacco: Never  Substance Use Topics   Alcohol use: No   Drug use: No    Review of Systems  Constitutional: Positive for fever/chills Eyes: No visual changes. ENT: Positive for resolving sore throat. Cardiovascular: Denies chest pain. Respiratory: Positive for cough and shortness of breath. Gastrointestinal: No abdominal pain.  No nausea, no vomiting.  No diarrhea.  No constipation. Genitourinary: Negative  for dysuria. Musculoskeletal: Negative for back pain.  Positive for atraumatic myalgias throughout Skin: Negative for rash. Neurological: Negative for headaches, focal weakness or numbness.  ____________________________________________   PHYSICAL EXAM:  VITAL SIGNS: Vitals:   09/01/21 1940 09/01/21 2217  BP: 107/80 107/81  Pulse: 98 90  Resp: 17 18  Temp: 98.6 F (37 C) 98 F (36.7 C)  SpO2: 100% 92%    Constitutional: Alert  and oriented. Quite thin and cachectic.  Slight tachypnea.  Speaking in full sentences though. Eyes: Conjunctivae are normal. PERRL. EOMI. Head: Atraumatic. Nose: No congestion/rhinnorhea. Mouth/Throat: Mucous membranes are moist.  Oropharynx non-erythematous. Neck: No stridor. No cervical spine tenderness to palpation. Cardiovascular: Tachycardic rate, regular rhythm. Grossly normal heart sounds.  Good peripheral circulation. Respiratory: Mild tachypnea without distress.  Prolonged expiratory phases and intermittent and diffuse expiratory wheezes throughout.  No focal features. Gastrointestinal: Soft , nondistended, nontender to palpation. No CVA tenderness. Musculoskeletal: No lower extremity tenderness nor edema.  No joint effusions. No signs of acute trauma. Neurologic:  Normal speech and language. No gross focal neurologic deficits are appreciated. No gait instability noted. Skin:  Skin is warm, dry and intact. No rash noted. Psychiatric: Mood and affect are normal. Speech and behavior are normal. ____________________________________________   LABS (all labs ordered are listed, but only abnormal results are displayed)  Labs Reviewed  RESP PANEL BY RT-PCR (FLU A&B, COVID) ARPGX2 - Abnormal; Notable for the following components:      Result Value   Influenza A by PCR POSITIVE (*)    All other components within normal limits  LACTIC ACID, PLASMA - Abnormal; Notable for the following components:   Lactic Acid, Venous 2.3 (*)    All other components within normal limits  LACTIC ACID, PLASMA - Abnormal; Notable for the following components:   Lactic Acid, Venous 2.3 (*)    All other components within normal limits  COMPREHENSIVE METABOLIC PANEL - Abnormal; Notable for the following components:   Sodium 132 (*)    Chloride 97 (*)    CO2 21 (*)    Glucose, Bld 120 (*)    BUN 22 (*)    AST 62 (*)    All other components within normal limits  URINALYSIS, ROUTINE W REFLEX MICROSCOPIC -  Abnormal; Notable for the following components:   Color, Urine YELLOW (*)    APPearance HAZY (*)    Hgb urine dipstick SMALL (*)    Ketones, ur 5 (*)    Protein, ur 100 (*)    Nitrite POSITIVE (*)    Bacteria, UA MANY (*)    All other components within normal limits  D-DIMER, QUANTITATIVE - Abnormal; Notable for the following components:   D-Dimer, Quant 0.86 (*)    All other components within normal limits  CBC WITH DIFFERENTIAL/PLATELET - Abnormal; Notable for the following components:   RBC 5.72 (*)    Hemoglobin 17.5 (*)    HCT 48.4 (*)    MCHC 36.2 (*)    All other components within normal limits  CBC WITH DIFFERENTIAL/PLATELET  POC URINE PREG, ED   ____________________________________________  12 Lead EKG  Sinus tachycardia with a rate of 140 bpm.  Leftward axis.  Normal intervals.  No STEMI. ____________________________________________  RADIOLOGY  ED MD interpretation:  CXR reviewed by me without evidence of acute cardiopulmonary pathology.  Official radiology report(s): DG Chest 2 View  Result Date: 09/01/2021 CLINICAL DATA:  Shortness of breath and cough. EXAM: CHEST - 2 VIEW COMPARISON:  12/27/2016  and prior radiographs FINDINGS: The cardiomediastinal silhouette is unremarkable. There is no evidence of focal airspace disease, pulmonary edema, suspicious pulmonary nodule/mass, pleural effusion, or pneumothorax. No acute bony abnormalities are identified. IMPRESSION: No active cardiopulmonary disease. Electronically Signed   By: Harmon Pier M.D.   On: 09/01/2021 17:53   CT Angio Chest PE W and/or Wo Contrast  Result Date: 09/01/2021 CLINICAL DATA:  flu positive, cigarette smoker, positive dimer. eval PE, infiltrate EXAM: CT ANGIOGRAPHY CHEST WITH CONTRAST TECHNIQUE: Multidetector CT imaging of the chest was performed using the standard protocol during bolus administration of intravenous contrast. Multiplanar CT image reconstructions and MIPs were obtained to evaluate  the vascular anatomy. CONTRAST:  42mL OMNIPAQUE IOHEXOL 350 MG/ML SOLN COMPARISON:  Radiograph earlier today FINDINGS: Cardiovascular: There are no filling defects within the pulmonary arteries to suggest pulmonary embolus. The thoracic aorta is normal in caliber. No dissection or acute aortic findings. The heart is normal in size. No pericardial effusion. Mediastinum/Nodes: Shotty bilateral hilar lymph nodes are typically reactive. No enlarged mediastinal lymph nodes. No visualized thyroid nodule. No esophageal wall thickening. Lungs/Pleura: Bronchial thickening with occasional areas of mucous plugging in the lower lobes. No focal airspace disease or pneumonia. No pleural effusion. No pulmonary nodule or mass. Upper Abdomen: No acute or unexpected findings. Musculoskeletal: There are no acute or suspicious osseous abnormalities. No chest wall soft tissue abnormalities. Review of the MIP images confirms the above findings. IMPRESSION: 1. No pulmonary embolus. 2. Bronchial thickening with occasional areas of mucous plugging in the lower lobes. No pneumonia or focal airspace disease. Electronically Signed   By: Narda Rutherford M.D.   On: 09/01/2021 21:35    ____________________________________________   PROCEDURES and INTERVENTIONS  Procedure(s) performed (including Critical Care):  .1-3 Lead EKG Interpretation Performed by: Delton Prairie, MD Authorized by: Delton Prairie, MD     Interpretation: abnormal     ECG rate:  120   ECG rate assessment: tachycardic     Rhythm: sinus tachycardia     Ectopy: none     Conduction: normal    Medications  ipratropium-albuterol (DUONEB) 0.5-2.5 (3) MG/3ML nebulizer solution 3 mL (3 mLs Nebulization Given 09/01/21 1834)  methylPREDNISolone sodium succinate (SOLU-MEDROL) 125 mg/2 mL injection 125 mg (125 mg Intravenous Given 09/01/21 1829)  lactated ringers bolus 1,000 mL (0 mLs Intravenous Stopped 09/01/21 2054)  acetaminophen (TYLENOL) tablet 1,000 mg (1,000 mg  Oral Given 09/01/21 1931)  lactated ringers bolus 1,000 mL (0 mLs Intravenous Stopped 09/01/21 2212)  iohexol (OMNIPAQUE) 350 MG/ML injection 75 mL (75 mLs Intravenous Contrast Given 09/01/21 2119)    ____________________________________________   MDM / ED COURSE   Lifelong smoker presents to the ED with about a week of shortness of breath and cough, found to have evidence of COPD exacerbation in the setting of acute influenza A, ultimately amenable to outpatient management.  Quite tachycardic in a sinus tach on arrival, noted to be slightly hypoxic on room air as well, but remains hemodynamically stable throughout.  Hemodynamics dramatically improved after breathing treatments and 1 L of IV fluids, normalizing back to room air.  EKG with sinus tachycardia, without ischemic features.  D-dimer returned positive and CTA chest pursued, effectively ruling out acute PE and further without evidence of CAP.  No PTX.  Viral swabbing returns positive for influenza A, as a likely trigger of her symptoms.  She has no inhalers or medications for her smoking at home, and anticipated degree of mild COPD at baseline.  Will discharge with  albuterol inhaler and steroid burst.  Considering the chronicity of her symptoms, no indications for Tamiflu.  We will discharge with return precautions.  Clinical Course as of 09/01/21 2235  Sat Sep 01, 2021  1930 USIV placed by me. She reports feeling much better after nebs. Tachycardia has resolved.  [DS]  2159 Reassessed. Reports feeling better.  Took off her supplemental oxygen about an hour ago and has been normoxic since then.  She is conversational with sats at 100% while I am in the room.  Tachycardia resolved.  We discussed a quick ambulation trial with pulse ox and outpatient management thereafter.  She is in agreement. [DS]    Clinical Course User Index [DS] Delton Prairie, MD    ____________________________________________   FINAL CLINICAL IMPRESSION(S) / ED  DIAGNOSES  Final diagnoses:  Influenza A  Shortness of breath  Cigarette smoker     ED Discharge Orders          Ordered    albuterol (VENTOLIN HFA) 108 (90 Base) MCG/ACT inhaler  Every 6 hours PRN        09/01/21 2210    predniSONE (DELTASONE) 50 MG tablet  Daily        09/01/21 2210             Myan Locatelli   Note:  This document was prepared using Dragon voice recognition software and may include unintentional dictation errors.    Delton Prairie, MD 09/01/21 2238

## 2021-09-01 NOTE — ED Notes (Signed)
Pt taken to CT at this time.

## 2021-09-01 NOTE — ED Notes (Signed)
Pt up to bathroom at this time without difficulty/ supplemental oxygen. Pt RA stat >95% (98-100%) Pt denies any difficulties breathing with or without exertion. MD made aware

## 2021-09-01 NOTE — ED Notes (Signed)
Pt back from CT at this time 

## 2021-09-01 NOTE — ED Notes (Signed)
Pt up independently to the bathroom at this time

## 2021-09-01 NOTE — ED Notes (Signed)
Pt placed on 2L [place] due to RA sat 88%, pt O2 increased 94%.
